# Patient Record
Sex: Male | Born: 1950 | Race: White | Hispanic: No | Marital: Married | Smoking: Former smoker
Health system: Southern US, Community
[De-identification: ages and names within clinical notes are randomized; demographics above are authoritative.]

## PROBLEM LIST (undated history)

## (undated) DIAGNOSIS — F32A Depression, unspecified: Secondary | ICD-10-CM

## (undated) DIAGNOSIS — Z9289 Personal history of other medical treatment: Secondary | ICD-10-CM

## (undated) DIAGNOSIS — C801 Malignant (primary) neoplasm, unspecified: Secondary | ICD-10-CM

## (undated) DIAGNOSIS — R200 Anesthesia of skin: Secondary | ICD-10-CM

## (undated) DIAGNOSIS — Z973 Presence of spectacles and contact lenses: Secondary | ICD-10-CM

## (undated) DIAGNOSIS — B029 Zoster without complications: Secondary | ICD-10-CM

## (undated) DIAGNOSIS — D72820 Lymphocytosis (symptomatic): Secondary | ICD-10-CM

## (undated) DIAGNOSIS — K5900 Constipation, unspecified: Secondary | ICD-10-CM

## (undated) DIAGNOSIS — J302 Other seasonal allergic rhinitis: Secondary | ICD-10-CM

## (undated) DIAGNOSIS — Z87442 Personal history of urinary calculi: Secondary | ICD-10-CM

## (undated) DIAGNOSIS — D494 Neoplasm of unspecified behavior of bladder: Secondary | ICD-10-CM

## (undated) HISTORY — DX: Zoster without complications: B02.9

## (undated) HISTORY — PX: SHOULDER ARTHROSCOPY WITH LABRAL REPAIR: SHX5691

## (undated) HISTORY — PX: LAMINECTOMY: SHX219

---

## 1998-10-25 ENCOUNTER — Encounter: Admission: RE | Admit: 1998-10-25 | Discharge: 1998-10-25 | Payer: Self-pay | Admitting: Family Medicine

## 1998-11-05 ENCOUNTER — Encounter: Payer: Self-pay | Admitting: Neurosurgery

## 1998-11-09 ENCOUNTER — Encounter: Payer: Self-pay | Admitting: Neurosurgery

## 1998-11-09 ENCOUNTER — Inpatient Hospital Stay (HOSPITAL_COMMUNITY): Admission: RE | Admit: 1998-11-09 | Discharge: 1998-11-10 | Payer: Self-pay | Admitting: Neurosurgery

## 2001-12-20 ENCOUNTER — Ambulatory Visit (HOSPITAL_COMMUNITY): Admission: RE | Admit: 2001-12-20 | Discharge: 2001-12-20 | Payer: Self-pay | Admitting: Gastroenterology

## 2005-01-09 DIAGNOSIS — B029 Zoster without complications: Secondary | ICD-10-CM

## 2005-01-09 HISTORY — DX: Zoster without complications: B02.9

## 2006-06-21 ENCOUNTER — Encounter: Admission: RE | Admit: 2006-06-21 | Discharge: 2006-06-21 | Payer: Self-pay | Admitting: Family Medicine

## 2006-09-13 ENCOUNTER — Encounter: Admission: RE | Admit: 2006-09-13 | Discharge: 2006-12-12 | Payer: Self-pay | Admitting: Family Medicine

## 2010-05-27 NOTE — Op Note (Signed)
   NAME:  Tyler Calderon, VONBARGEN NO.:  [id number]   MEDICAL RECORD NO.:  [id number]                   PATIENT TYPE:  AMB   LOCATION:  ENDO                                 FACILITY:  [facility]   PHYSICIAN:  Danise Edge, M.D.                DATE OF BIRTH:  1950/08/31   DATE OF PROCEDURE:  DATE OF DISCHARGE:                                 OPERATIVE REPORT   INDICATIONS FOR PROCEDURE:  The patient is a 60 year-old male born May 01, 1950.  The patient is scheduled to undergo his first screening colonoscopy  with polypectomy to prevent colon cancer.  There is no personal or family  history of colon cancer.   ENDOSCOPIST:  Danise Edge, M.D.   PREMEDICATION:  Versed 7 mg and Fentanyl 50 mcg.   ENDOSCOPE:  Olympus pediatric colonoscope.   PROCEDURE:  After obtaining informed consent the patient was placed in the  left lateral decubitus position.  I administered intravenous Fentanyl and  intravenous Versed to achieve conscious sedation for the procedure.  The  patient's blood pressure, oxygen saturation and cardiac rhythm were  monitored throughout the procedure and documented in the medical record.   Anal inspection was normal.  Digital rectal exam was normal.  The Olympus  pediatric video colonoscope was introduced into the rectum and easily  advanced to the cecum.  Colonic preparation for the exam today was  excellent.   Rectum:  Normal.   Sigmoid colon and descending colon:  Normal.   Splenic flexure:  Normal.   Transverse colon:  Normal.   Hepatic flexure: Normal.   Ascending colon:  Normal.   Cecum and ileocecal valve:  Normal.    ASSESSMENT:  Normal screening proctocolonoscopy  to the cecum.  No  endoscopic evidence for the presence of colorectal neoplasia.                                               Danise Edge, M.D.    MJ/MEDQ  D:  12/20/2001  T:  12/21/2001  Job:  [id number]   cc:   Dellis Anes. Idell Pickles, M.D.  [address]  [place]  [place] [address]  Fax: [phone]

## 2016-03-28 ENCOUNTER — Ambulatory Visit (INDEPENDENT_AMBULATORY_CARE_PROVIDER_SITE_OTHER): Payer: BLUE CROSS/BLUE SHIELD | Admitting: Allergy and Immunology

## 2016-03-28 ENCOUNTER — Encounter: Payer: Self-pay | Admitting: Allergy and Immunology

## 2016-03-28 VITALS — BP 132/92 | HR 84 | Temp 99.0°F | Resp 18 | Ht 73.31 in | Wt 185.0 lb

## 2016-03-28 DIAGNOSIS — J3089 Other allergic rhinitis: Secondary | ICD-10-CM | POA: Diagnosis not present

## 2016-03-28 MED ORDER — MONTELUKAST SODIUM 10 MG PO TABS: 10.0000 mg | ORAL_TABLET | Freq: Every day | ORAL | 5 refills | Status: DC

## 2016-03-28 NOTE — Progress Notes (Signed)
NEW PATIENT NOTE  Referring Provider: No ref. provider found Primary Provider: Cari Caraway, MD Date of office visit: 03/28/2016    Subjective:   Chief Complaint:  Tyler Calderon (DOB: Jul 23, 1950) is a 66 y.o. male who presents to the clinic on 03/28/2016 with a chief complaint of Nasal Congestion .  HPI: Tyler Calderon presents to this clinic in evaluation of allergic disease. Apparently he has undergone 2 courses of immunotherapy with his initial course taking place over 10 years with a break of 2 years and then his most recent course occurring over the period of 10 years. He thinks that he was skin tested about 3 years ago and had a new formulation made as a result of those results. He thinks that he was allergic to grasses and oak pollen and molds. His last injection was approximately 5 weeks ago. It is interesting to note that his skin test reactivity apparently does not change as of result of receiving immunotherapy.  His biggest complaint is that he goes through these episodes throughout the year, usually in the spring and fall, where he develops nasal congestion and sneezing and stuffy nose and postnasal drip and a cough with a rattle in his chest without any laryngitis or anosmia or decreased ability to taste. He does not develop shortness of breath or chest tightness and he has no associated gagging or retching or posttussive emesis. Usually he has clear nasal discharge but occasionally can be green. He will have this episode for 2 weeks and then he has a lingering cough that goes on for an additional 2 weeks. He will take NyQuil and DayQuil during this timeframe. These episodes appear to occur at least 2 and sometimes 3 times a year.He does not note any obvious trigger giving rise to this issue.  Past Medical History:  Diagnosis Date  . Shingles     Past Surgical History:  Procedure Laterality Date  . LAMINECTOMY  1980s   Two surgeries    Allergies as of 03/28/2016   No Known  Allergies     Medication List      ASPIRIN 81 PO Take by mouth daily.   cetirizine 10 MG tablet Commonly known as:  ZYRTEC Take 10 mg by mouth daily.   MULTIVITAMIN GUMMIES ADULT PO Take by mouth daily.   simvastatin 20 MG tablet Commonly known as:  ZOCOR Take 20 mg by mouth daily.   VITAMIN C ADULT GUMMIES PO Take by mouth daily.       Review of systems negative except as noted in HPI / PMHx or noted below:  Review of Systems  Constitutional: Negative.   HENT: Negative.   Eyes: Negative.   Respiratory: Negative.   Cardiovascular: Negative.   Gastrointestinal: Negative.   Genitourinary: Negative.   Musculoskeletal: Negative.   Skin: Negative.   Neurological: Negative.   Endo/Heme/Allergies: Negative.   Psychiatric/Behavioral: Negative.     Family History  Problem Relation Age of Onset  . Neurologic Disorder Mother     Corticobasal Syndrome  . High blood pressure Father   . Stroke Father   . High Cholesterol Sister   . High Cholesterol Brother   . Diabetes Maternal Uncle     Social History   Social History  . Marital status: Married    Spouse name: N/A  . Number of children: N/A  . Years of education: N/A   Occupational History  . Not on file.   Social History Main Topics  . Smoking  status: Former Smoker    Types: Cigarettes  . Smokeless tobacco: Not on file  . Alcohol use Not on file  . Drug use: Unknown  . Sexual activity: Not on file   Other Topics Concern  . Not on file   Social History Narrative  . No narrative on file    Environmental and Social history  Lives in a house with a dry environment, carpeting in the bedroom, no animals located inside the household, no plastic on the bed or pillow, and no smokers located inside the household. He is a retired Dance movement psychotherapist.  Objective:   Vitals:   03/28/16 1347  BP: (!) 132/92  Pulse: 84  Resp: 18  Temp: 99 F (37.2 C)   Height: 6' 1.31" (186.2 cm) Weight: 185 lb (83.9  kg)  Physical Exam  Constitutional: He is well-developed, well-nourished, and in no distress.  HENT:  Head: Normocephalic.  Right Ear: Tympanic membrane, external ear and ear canal normal.  Left Ear: Tympanic membrane, external ear and ear canal normal.  Nose: Nose normal. No mucosal edema or rhinorrhea.  Mouth/Throat: Uvula is midline, oropharynx is clear and moist and mucous membranes are normal. No oropharyngeal exudate.  Eyes: Conjunctivae are normal.  Neck: Trachea normal. No tracheal tenderness present. No tracheal deviation present. No thyromegaly present.  Cardiovascular: Normal rate, regular rhythm, S1 normal, S2 normal and normal heart sounds.   No murmur heard. Pulmonary/Chest: Breath sounds normal. No stridor. No respiratory distress. He has no wheezes. He has no rales.  Musculoskeletal: He exhibits no edema.  Lymphadenopathy:       Head (right side): No tonsillar adenopathy present.       Head (left side): No tonsillar adenopathy present.    He has no cervical adenopathy.  Neurological: He is alert. Gait normal.  Skin: No rash noted. He is not diaphoretic. No erythema. Nails show no clubbing.  Psychiatric: Mood and affect normal.    Diagnostics: Allergy skin tests were not performed.   Assessment and Plan:    1. Other allergic rhinitis     1. OTC Rhinocort/Nasacort - one spray each nostril 3-7 times per week. Take several days to work.  2. Montelukast 10 mg tablet - one tablet one time per day  3. If needed:   A. cetirizine 10 mg tablet 1 time per day  B. Nasal saline  C. Mucinex DM 2 tablets twice a  4. Further evaluation?  5. Contact clinic if problem in the future  6. Return to clinic in 6 months or earlier if problem  7. Grastek? Ragwitek?  I will have Tyler Calderon utilize a preventative medication plan in the hope of preventing the development of these episodes that he develops during the spring and fall season. It does not appear as though he has  responded adequately to immunotherapy and maybe this form of therapy just will not terminate his atopic sensitivity to environmental allergens. He may benefit from a course of oral immunotherapy directed at grass or ragweed but prior to committing him to that form of therapy will just see how he does with a combination of a nasal steroid and a leukotriene modifier as we go through this upcoming springtime season. He will contact me should there significant problem in the face of utilizing this therapy.  Allena Katz, MD Allergy / Immunology [place]

## 2016-03-28 NOTE — Patient Instructions (Addendum)
  1. OTC Rhinocort/Nasacort - one spray each nostril 3-7 times per week. Take several days to work.  2. Montelukast 10 mg tablet - one tablet one time per day  3. If needed:   A. cetirizine 10 mg tablet 1 time per day  B. Nasal saline  C. Mucinex DM 2 tablets twice a  4. Further evaluation?  5. Contact clinic if problem in the future  6. Return to clinic in 6 months or earlier if problem  7. Grastek? Ragwitek?

## 2016-03-30 ENCOUNTER — Telehealth: Payer: Self-pay | Admitting: Allergy and Immunology

## 2016-03-30 MED ORDER — MONTELUKAST SODIUM 10 MG PO TABS: 10.0000 mg | ORAL_TABLET | Freq: Every day | ORAL | 0 refills | Status: DC

## 2016-03-30 NOTE — Telephone Encounter (Signed)
Patient requested 10 day supply of montelukast due to insurance lapse. I have sent this in.

## 2016-03-30 NOTE — Telephone Encounter (Signed)
Pt called and said that [facility] would not fill his rx because of medicare so he wants it called into [facility] on [address] [phone].

## 2016-05-22 DIAGNOSIS — L821 Other seborrheic keratosis: Secondary | ICD-10-CM | POA: Diagnosis not present

## 2016-05-22 DIAGNOSIS — D1801 Hemangioma of skin and subcutaneous tissue: Secondary | ICD-10-CM | POA: Diagnosis not present

## 2016-05-22 DIAGNOSIS — D2221 Melanocytic nevi of right ear and external auricular canal: Secondary | ICD-10-CM | POA: Diagnosis not present

## 2016-05-22 DIAGNOSIS — L570 Actinic keratosis: Secondary | ICD-10-CM | POA: Diagnosis not present

## 2016-05-22 DIAGNOSIS — D2372 Other benign neoplasm of skin of left lower limb, including hip: Secondary | ICD-10-CM | POA: Diagnosis not present

## 2016-05-22 DIAGNOSIS — D2261 Melanocytic nevi of right upper limb, including shoulder: Secondary | ICD-10-CM | POA: Diagnosis not present

## 2016-05-22 DIAGNOSIS — D2262 Melanocytic nevi of left upper limb, including shoulder: Secondary | ICD-10-CM | POA: Diagnosis not present

## 2016-05-22 DIAGNOSIS — D485 Neoplasm of uncertain behavior of skin: Secondary | ICD-10-CM | POA: Diagnosis not present

## 2016-05-22 DIAGNOSIS — Z85828 Personal history of other malignant neoplasm of skin: Secondary | ICD-10-CM | POA: Diagnosis not present

## 2016-05-22 DIAGNOSIS — D225 Melanocytic nevi of trunk: Secondary | ICD-10-CM | POA: Diagnosis not present

## 2016-05-22 DIAGNOSIS — D2272 Melanocytic nevi of left lower limb, including hip: Secondary | ICD-10-CM | POA: Diagnosis not present

## 2016-05-22 DIAGNOSIS — D2271 Melanocytic nevi of right lower limb, including hip: Secondary | ICD-10-CM | POA: Diagnosis not present

## 2016-08-01 DIAGNOSIS — Z0000 Encounter for general adult medical examination without abnormal findings: Secondary | ICD-10-CM | POA: Diagnosis not present

## 2016-08-01 DIAGNOSIS — N400 Enlarged prostate without lower urinary tract symptoms: Secondary | ICD-10-CM | POA: Diagnosis not present

## 2016-08-01 DIAGNOSIS — G479 Sleep disorder, unspecified: Secondary | ICD-10-CM | POA: Diagnosis not present

## 2016-08-01 DIAGNOSIS — M79672 Pain in left foot: Secondary | ICD-10-CM | POA: Diagnosis not present

## 2016-08-01 DIAGNOSIS — Z125 Encounter for screening for malignant neoplasm of prostate: Secondary | ICD-10-CM | POA: Diagnosis not present

## 2016-08-01 DIAGNOSIS — R197 Diarrhea, unspecified: Secondary | ICD-10-CM | POA: Diagnosis not present

## 2016-08-01 DIAGNOSIS — E7800 Pure hypercholesterolemia, unspecified: Secondary | ICD-10-CM | POA: Diagnosis not present

## 2016-08-04 DIAGNOSIS — Z0000 Encounter for general adult medical examination without abnormal findings: Secondary | ICD-10-CM | POA: Diagnosis not present

## 2016-08-04 DIAGNOSIS — N401 Enlarged prostate with lower urinary tract symptoms: Secondary | ICD-10-CM | POA: Diagnosis not present

## 2016-08-04 DIAGNOSIS — G479 Sleep disorder, unspecified: Secondary | ICD-10-CM | POA: Diagnosis not present

## 2016-08-04 DIAGNOSIS — E7800 Pure hypercholesterolemia, unspecified: Secondary | ICD-10-CM | POA: Diagnosis not present

## 2016-08-04 DIAGNOSIS — M79672 Pain in left foot: Secondary | ICD-10-CM | POA: Diagnosis not present

## 2016-08-04 DIAGNOSIS — J309 Allergic rhinitis, unspecified: Secondary | ICD-10-CM | POA: Diagnosis not present

## 2016-08-04 DIAGNOSIS — Z1389 Encounter for screening for other disorder: Secondary | ICD-10-CM | POA: Diagnosis not present

## 2016-08-04 DIAGNOSIS — R3914 Feeling of incomplete bladder emptying: Secondary | ICD-10-CM | POA: Diagnosis not present

## 2016-08-04 DIAGNOSIS — Z23 Encounter for immunization: Secondary | ICD-10-CM | POA: Diagnosis not present

## 2016-08-16 DIAGNOSIS — M7751 Other enthesopathy of right foot: Secondary | ICD-10-CM | POA: Diagnosis not present

## 2016-08-16 DIAGNOSIS — G5762 Lesion of plantar nerve, left lower limb: Secondary | ICD-10-CM | POA: Diagnosis not present

## 2016-08-16 DIAGNOSIS — G5761 Lesion of plantar nerve, right lower limb: Secondary | ICD-10-CM | POA: Diagnosis not present

## 2016-08-16 DIAGNOSIS — M21962 Unspecified acquired deformity of left lower leg: Secondary | ICD-10-CM | POA: Diagnosis not present

## 2016-08-16 DIAGNOSIS — M7752 Other enthesopathy of left foot: Secondary | ICD-10-CM | POA: Diagnosis not present

## 2016-08-16 DIAGNOSIS — M21961 Unspecified acquired deformity of right lower leg: Secondary | ICD-10-CM | POA: Diagnosis not present

## 2016-08-16 DIAGNOSIS — M205X2 Other deformities of toe(s) (acquired), left foot: Secondary | ICD-10-CM | POA: Diagnosis not present

## 2016-08-29 ENCOUNTER — Ambulatory Visit (INDEPENDENT_AMBULATORY_CARE_PROVIDER_SITE_OTHER): Payer: Medicare Other | Admitting: Allergy and Immunology

## 2016-08-29 ENCOUNTER — Encounter: Payer: Self-pay | Admitting: Allergy and Immunology

## 2016-08-29 VITALS — BP 130/80 | HR 72 | Resp 18

## 2016-08-29 DIAGNOSIS — J3089 Other allergic rhinitis: Secondary | ICD-10-CM

## 2016-08-29 NOTE — Patient Instructions (Addendum)
  1. Nasacort - one spray each nostril 3-7 times per week.    2. Montelukast 10 mg tablet - one tablet one time per day  3. If needed:   A. cetirizine 10 mg tablet 1 time per day  B. Nasal saline  C. Mucinex DM 2 tablets twice a day  4. Return to clinic in 12 months or earlier if problem  5. Obtain fall flu vaccine

## 2016-08-29 NOTE — Progress Notes (Signed)
Follow-up Note  Referring Provider: Cari Caraway, MD Primary Provider: Cari Caraway, MD Date of Office Visit: 08/29/2016  Subjective:   Tyler Calderon (DOB: 05-25-1950) is a 66 y.o. male who returns to the [place] on 08/29/2016 in re-evaluation of the following:  HPI: Tyler Calderon returns to this clinic in reevaluation of his allergic rhinitis. I last saw him in this clinic 03/28/2016 at which point in time we assigned him a plan to use preventative anti-inflammatory medications for his respiratory tract.  He has had an excellent response to this approach and has been able to go through the spring with no problem whatsoever. He has not developed either a sinus infection or an episode of bronchitis and overall feels good while consistently using Nasacort and montelukast. He no longer requires a antihistamine.  Allergies as of 08/29/2016   No Known Allergies     Medication List      ASPIRIN 81 PO Take by mouth daily.   montelukast 10 MG tablet Commonly known as:  SINGULAIR Take 1 tablet (10 mg total) by mouth at bedtime.   MULTIVITAMIN GUMMIES ADULT PO Take by mouth daily.   NASACORT ALLERGY 24HR 55 MCG/ACT Aero nasal inhaler Generic drug:  triamcinolone Use one spray in each nostril once daily as directed.   simvastatin 20 MG tablet Commonly known as:  ZOCOR Take 20 mg by mouth daily.   tamsulosin 0.4 MG Caps capsule Commonly known as:  FLOMAX       Past Medical History:  Diagnosis Date  . Shingles     Past Surgical History:  Procedure Laterality Date  . LAMINECTOMY  1980s   Two surgeries    Review of systems negative except as noted in HPI / PMHx or noted below:  Review of Systems  Constitutional: Negative.   HENT: Negative.   Eyes: Negative.   Respiratory: Negative.   Cardiovascular: Negative.   Gastrointestinal: Negative.   Genitourinary: Negative.   Musculoskeletal: Negative.   Skin: Negative.   Neurological: Negative.     Endo/Heme/Allergies: Negative.   Psychiatric/Behavioral: Negative.      Objective:   Vitals:   08/29/16 1507  BP: 130/80  Pulse: 72  Resp: 18          Physical Exam  Constitutional: He is well-developed, well-nourished, and in no distress.  HENT:  Head: Normocephalic.  Right Ear: Tympanic membrane, external ear and ear canal normal.  Left Ear: Tympanic membrane, external ear and ear canal normal.  Nose: Nose normal. No mucosal edema or rhinorrhea.  Mouth/Throat: Uvula is midline, oropharynx is clear and moist and mucous membranes are normal. No oropharyngeal exudate.  Eyes: Conjunctivae are normal.  Neck: Trachea normal. No tracheal tenderness present. No tracheal deviation present. No thyromegaly present.  Cardiovascular: Normal rate, regular rhythm, S1 normal, S2 normal and normal heart sounds.   No murmur heard. Pulmonary/Chest: Breath sounds normal. No stridor. No respiratory distress. He has no wheezes. He has no rales.  Musculoskeletal: He exhibits no edema.  Lymphadenopathy:       Head (right side): No tonsillar adenopathy present.       Head (left side): No tonsillar adenopathy present.    He has no cervical adenopathy.  Neurological: He is alert. Gait normal.  Skin: No rash noted. He is not diaphoretic. No erythema. Nails show no clubbing.  Psychiatric: Mood and affect normal.    Diagnostics: none  Assessment and Plan:   1. Other allergic rhinitis  1. Nasacort - one spray each nostril 3-7 times per week.    2. Montelukast 10 mg tablet - one tablet one time per day  3. If needed:   A. cetirizine 10 mg tablet 1 time per day  B. Nasal saline  C. Mucinex DM 2 tablets twice a day  4. Return to clinic in 12 months or earlier if problem  5. Obtain fall flu vaccine  Tyler Calderon appears to be doing quite well on his current plan and I will assume that he will go through the rest of this year without any difficulty and he will contact me should there be a  significant problem as he moves through this next year. He will try to modify the amounts of Nasacort required to maintain good control of his atopic disease allways aiming for the least amount of medication that gives him that result.  Allena Katz, MD Allergy / Immunology [place]

## 2016-08-30 DIAGNOSIS — M7752 Other enthesopathy of left foot: Secondary | ICD-10-CM | POA: Diagnosis not present

## 2016-08-30 DIAGNOSIS — M7751 Other enthesopathy of right foot: Secondary | ICD-10-CM | POA: Diagnosis not present

## 2016-08-30 DIAGNOSIS — M21961 Unspecified acquired deformity of right lower leg: Secondary | ICD-10-CM | POA: Diagnosis not present

## 2016-08-30 DIAGNOSIS — G5762 Lesion of plantar nerve, left lower limb: Secondary | ICD-10-CM | POA: Diagnosis not present

## 2016-08-30 DIAGNOSIS — M21962 Unspecified acquired deformity of left lower leg: Secondary | ICD-10-CM | POA: Diagnosis not present

## 2016-09-13 DIAGNOSIS — G5762 Lesion of plantar nerve, left lower limb: Secondary | ICD-10-CM | POA: Diagnosis not present

## 2016-09-13 DIAGNOSIS — M722 Plantar fascial fibromatosis: Secondary | ICD-10-CM | POA: Diagnosis not present

## 2016-09-13 DIAGNOSIS — G5761 Lesion of plantar nerve, right lower limb: Secondary | ICD-10-CM | POA: Diagnosis not present

## 2016-09-25 ENCOUNTER — Other Ambulatory Visit: Payer: Self-pay | Admitting: Allergy and Immunology

## 2016-10-10 DIAGNOSIS — G5762 Lesion of plantar nerve, left lower limb: Secondary | ICD-10-CM | POA: Diagnosis not present

## 2016-10-10 DIAGNOSIS — M722 Plantar fascial fibromatosis: Secondary | ICD-10-CM | POA: Diagnosis not present

## 2016-10-10 DIAGNOSIS — G5761 Lesion of plantar nerve, right lower limb: Secondary | ICD-10-CM | POA: Diagnosis not present

## 2016-10-26 DIAGNOSIS — Z23 Encounter for immunization: Secondary | ICD-10-CM | POA: Diagnosis not present

## 2016-12-12 DIAGNOSIS — M722 Plantar fascial fibromatosis: Secondary | ICD-10-CM | POA: Diagnosis not present

## 2016-12-12 DIAGNOSIS — G5762 Lesion of plantar nerve, left lower limb: Secondary | ICD-10-CM | POA: Diagnosis not present

## 2016-12-12 DIAGNOSIS — G5761 Lesion of plantar nerve, right lower limb: Secondary | ICD-10-CM | POA: Diagnosis not present

## 2017-01-03 ENCOUNTER — Other Ambulatory Visit: Payer: Self-pay

## 2017-01-03 MED ORDER — MONTELUKAST SODIUM 10 MG PO TABS: 10.0000 mg | ORAL_TABLET | Freq: Every day | ORAL | 1 refills | Status: DC

## 2017-01-03 NOTE — Telephone Encounter (Signed)
Received fax for 90 day supply of montelukast. Patient was last seen 08/29/2016. Patient is to return in a year. Request sent in.

## 2017-02-01 NOTE — Telephone Encounter (Signed)
error 

## 2017-05-22 DIAGNOSIS — D2262 Melanocytic nevi of left upper limb, including shoulder: Secondary | ICD-10-CM | POA: Diagnosis not present

## 2017-05-22 DIAGNOSIS — L570 Actinic keratosis: Secondary | ICD-10-CM | POA: Diagnosis not present

## 2017-05-22 DIAGNOSIS — D485 Neoplasm of uncertain behavior of skin: Secondary | ICD-10-CM | POA: Diagnosis not present

## 2017-05-22 DIAGNOSIS — D2261 Melanocytic nevi of right upper limb, including shoulder: Secondary | ICD-10-CM | POA: Diagnosis not present

## 2017-05-22 DIAGNOSIS — L821 Other seborrheic keratosis: Secondary | ICD-10-CM | POA: Diagnosis not present

## 2017-05-22 DIAGNOSIS — D2272 Melanocytic nevi of left lower limb, including hip: Secondary | ICD-10-CM | POA: Diagnosis not present

## 2017-05-22 DIAGNOSIS — D225 Melanocytic nevi of trunk: Secondary | ICD-10-CM | POA: Diagnosis not present

## 2017-05-22 DIAGNOSIS — D1801 Hemangioma of skin and subcutaneous tissue: Secondary | ICD-10-CM | POA: Diagnosis not present

## 2017-05-22 DIAGNOSIS — Z85828 Personal history of other malignant neoplasm of skin: Secondary | ICD-10-CM | POA: Diagnosis not present

## 2017-07-11 ENCOUNTER — Other Ambulatory Visit: Payer: Self-pay | Admitting: Allergy and Immunology

## 2017-08-07 DIAGNOSIS — N529 Male erectile dysfunction, unspecified: Secondary | ICD-10-CM | POA: Diagnosis not present

## 2017-08-07 DIAGNOSIS — Z0000 Encounter for general adult medical examination without abnormal findings: Secondary | ICD-10-CM | POA: Diagnosis not present

## 2017-08-07 DIAGNOSIS — G479 Sleep disorder, unspecified: Secondary | ICD-10-CM | POA: Diagnosis not present

## 2017-08-07 DIAGNOSIS — N401 Enlarged prostate with lower urinary tract symptoms: Secondary | ICD-10-CM | POA: Diagnosis not present

## 2017-08-07 DIAGNOSIS — Z1389 Encounter for screening for other disorder: Secondary | ICD-10-CM | POA: Diagnosis not present

## 2017-08-07 DIAGNOSIS — E7800 Pure hypercholesterolemia, unspecified: Secondary | ICD-10-CM | POA: Diagnosis not present

## 2017-08-07 DIAGNOSIS — J309 Allergic rhinitis, unspecified: Secondary | ICD-10-CM | POA: Diagnosis not present

## 2017-08-07 DIAGNOSIS — Z125 Encounter for screening for malignant neoplasm of prostate: Secondary | ICD-10-CM | POA: Diagnosis not present

## 2017-08-07 DIAGNOSIS — Z23 Encounter for immunization: Secondary | ICD-10-CM | POA: Diagnosis not present

## 2017-08-08 DIAGNOSIS — E7800 Pure hypercholesterolemia, unspecified: Secondary | ICD-10-CM | POA: Diagnosis not present

## 2017-08-08 DIAGNOSIS — N401 Enlarged prostate with lower urinary tract symptoms: Secondary | ICD-10-CM | POA: Diagnosis not present

## 2017-08-08 DIAGNOSIS — N529 Male erectile dysfunction, unspecified: Secondary | ICD-10-CM | POA: Diagnosis not present

## 2017-08-08 DIAGNOSIS — G479 Sleep disorder, unspecified: Secondary | ICD-10-CM | POA: Diagnosis not present

## 2017-08-08 DIAGNOSIS — Z125 Encounter for screening for malignant neoplasm of prostate: Secondary | ICD-10-CM | POA: Diagnosis not present

## 2017-08-08 DIAGNOSIS — J309 Allergic rhinitis, unspecified: Secondary | ICD-10-CM | POA: Diagnosis not present

## 2017-08-11 ENCOUNTER — Other Ambulatory Visit: Payer: Self-pay | Admitting: Allergy and Immunology

## 2017-08-21 ENCOUNTER — Encounter: Payer: Self-pay | Admitting: Allergy and Immunology

## 2017-08-21 ENCOUNTER — Ambulatory Visit (INDEPENDENT_AMBULATORY_CARE_PROVIDER_SITE_OTHER): Payer: Medicare Other | Admitting: Allergy and Immunology

## 2017-08-21 VITALS — BP 120/60 | HR 80 | Resp 16 | Ht 73.5 in | Wt 177.8 lb

## 2017-08-21 DIAGNOSIS — R0609 Other forms of dyspnea: Secondary | ICD-10-CM | POA: Diagnosis not present

## 2017-08-21 DIAGNOSIS — J3089 Other allergic rhinitis: Secondary | ICD-10-CM

## 2017-08-21 DIAGNOSIS — R0600 Dyspnea, unspecified: Secondary | ICD-10-CM

## 2017-08-21 MED ORDER — MONTELUKAST SODIUM 10 MG PO TABS: ORAL_TABLET | ORAL | 11 refills | Status: DC

## 2017-08-21 NOTE — Patient Instructions (Addendum)
  1. Continue Nasacort - one spray each nostril 3-7 times per week.    2. Continue Montelukast 10 mg tablet - one tablet one time per day  3. If needed:   A. cetirizine 10 mg tablet 1 time per day  B. Nasal saline  C. Mucinex DM 2 tablets twice a day  4. Return to clinic in 12 months or earlier if problem  5. Obtain fall flu vaccine  6. Discuss with primary MD about SOB. Exercise treadmill test? Chest X-ray? Blood tests?

## 2017-08-21 NOTE — Progress Notes (Signed)
Follow-up Note  Referring Provider: Cari Caraway, MD Primary Provider: Cari Caraway, MD Date of Office Visit: 08/21/2017  Subjective:   Tyler Calderon (DOB: [birth year]) is a 67 y.o. male who returns to the [place] on 08/21/2017 in re-evaluation of the following:  HPI: Tyler Calderon returns to this clinic in evaluation of his allergic rhinitis and an issue with shortness of breath.  I last saw him in this clinic on 29 August 2016.  While consistently using a nasal steroid and a leukotriene modifier he has done very well with his airway issue and rarely has any significant upper airway symptoms.  He has not required an antibiotic or systemic steroid to treat any type of respiratory tract issue.  However, he has developed a problems with dyspnea on exertion over the course of the past 3 to 4 months.  Apparently if he exerts himself such as going up a flight of stairs or if he is using his arms and walking such as carrying a box he does get short and he must stop his activity and he must rest for 15 to 30 seconds at which point in time he resolves his shortness of breath.  He has no associated chest pain or coughing or wheezing or other respiratory symptoms.  There has not really been a significant environmental change in his house and he has not started any new medications that may contribute to this issue.  Allergies as of 08/21/2017   No Known Allergies     Medication List      ASPIRIN 81 PO Take by mouth daily.   LORazepam 0.5 MG tablet Commonly known as:  ATIVAN 1-2 TABS BEFORE BEDTIME AS NEEDED ORALLY 30 DAYS   montelukast 10 MG tablet Commonly known as:  SINGULAIR TAKE 1 TABLET BY MOUTH EVERYDAY AT BEDTIME   MULTIVITAMIN GUMMIES ADULT PO Take by mouth daily.   NASACORT ALLERGY 24HR 55 MCG/ACT Aero nasal inhaler Generic drug:  triamcinolone Use one spray in each nostril once daily as directed.   simvastatin 20 MG tablet Commonly known as:  ZOCOR Take  20 mg by mouth daily.   tamsulosin 0.4 MG Caps capsule Commonly known as:  FLOMAX       Past Medical History:  Diagnosis Date  . Shingles     Past Surgical History:  Procedure Laterality Date  . LAMINECTOMY  1980s   Two surgeries    Review of systems negative except as noted in HPI / PMHx or noted below:  Review of Systems  Constitutional: Negative.   HENT: Negative.   Eyes: Negative.   Respiratory: Negative.   Cardiovascular: Negative.   Gastrointestinal: Negative.   Genitourinary: Negative.   Musculoskeletal: Negative.   Skin: Negative.   Neurological: Negative.   Endo/Heme/Allergies: Negative.   Psychiatric/Behavioral: Negative.      Objective:   Vitals:   08/21/17 1055  BP: 120/60  Pulse: 80  Resp: 16   Height: 6' 1.5" (186.7 cm)  Weight: 177 lb 12.8 oz (80.6 kg)   Physical Exam  HENT:  Head: Normocephalic.  Right Ear: Tympanic membrane, external ear and ear canal normal.  Left Ear: Tympanic membrane, external ear and ear canal normal.  Nose: Nose normal. No mucosal edema or rhinorrhea.  Mouth/Throat: Uvula is midline, oropharynx is clear and moist and mucous membranes are normal. No oropharyngeal exudate.  Eyes: Conjunctivae are normal.  Neck: Trachea normal. No tracheal tenderness present. No tracheal deviation present. No thyromegaly present.  Cardiovascular:  Normal rate, regular rhythm, S1 normal, S2 normal and normal heart sounds.  No murmur heard. Pulmonary/Chest: Breath sounds normal. No stridor. No respiratory distress. He has no wheezes. He has no rales.  Musculoskeletal: He exhibits no edema.  Lymphadenopathy:       Head (right side): No tonsillar adenopathy present.       Head (left side): No tonsillar adenopathy present.    He has no cervical adenopathy.  Neurological: He is alert.  Skin: No rash noted. He is not diaphoretic. No erythema. Nails show no clubbing.    Diagnostics:    Spirometry was performed and demonstrated an FEV1  of 3.79 at 96 % of predicted.  Oxygen saturation at rest on room air was 98%.  Oxygen saturation with exercise walking up and down the hallway on room air was 98%.  Assessment and Plan:   1. Other allergic rhinitis   2. Dyspnea on exertion     1. Continue Nasacort - one spray each nostril 3-7 times per week.    2. Continue Montelukast 10 mg tablet - one tablet one time per day  3. If needed:   A. cetirizine 10 mg tablet 1 time per day  B. Nasal saline  C. Mucinex DM 2 tablets twice a day  4. Return to clinic in 12 months or earlier if problem  5. Obtain fall flu vaccine  6. Discuss with primary MD about SOB. Exercise treadmill test? Chest X-ray? Blood tests?  Tyler Calderon appears to have his atopic respiratory disease under very good control on his current plan and I am not really going to change any of his medicines.  I asked him to discuss with his primary doctor about his dyspnea on exertion.  It may be best for him to have a exercise treadmill test and a chest x-ray and blood tests in  investigation for a systemic disease contributing to this issue.  If he does well I will see him back in this clinic in 12 months or earlier if there is a problem.  Allena Katz, MD Allergy / Immunology [place]

## 2017-08-22 ENCOUNTER — Encounter: Payer: Self-pay | Admitting: Allergy and Immunology

## 2017-08-28 ENCOUNTER — Telehealth: Payer: Self-pay | Admitting: Allergy and Immunology

## 2017-08-28 DIAGNOSIS — R0609 Other forms of dyspnea: Secondary | ICD-10-CM

## 2017-08-28 DIAGNOSIS — R0600 Dyspnea, unspecified: Secondary | ICD-10-CM

## 2017-08-28 NOTE — Telephone Encounter (Signed)
Will you please advise 

## 2017-08-28 NOTE — Telephone Encounter (Signed)
Patient was told to call his PCP to set up a stress test per KOZLOW. Patients PCP will not schedule or order one with an evaluation Patient is hoping that KOZLOW can send in the order for a stress test

## 2017-08-28 NOTE — Telephone Encounter (Signed)
I do not really understand the message.  Is the issue that his primary care doctor will not order a exercise test unless he is seen by her his primary care doctor?

## 2017-08-29 NOTE — Telephone Encounter (Signed)
I called and spoke with Tyler Calderon and his Primary will not order the stress test unless he makes an appointment with them. He wanted to know if you would just go ahead and order the stress test so that it could move along faster. I told him I would reach out and ask and then give him a call back. Will you please advise? Thank You.

## 2017-09-03 NOTE — Telephone Encounter (Signed)
Please arrange for Chest X-ray, CBC w/diff, and a exercise treadmill test

## 2017-09-04 NOTE — Telephone Encounter (Signed)
Patient is in [place] for 10 more day and will get the chest xray and blood test when he returns. Please schedule stress test after as well.

## 2017-09-04 NOTE — Telephone Encounter (Signed)
Left message for patient to call the office

## 2017-09-04 NOTE — Telephone Encounter (Signed)
Please review and sign orders.

## 2017-09-13 ENCOUNTER — Ambulatory Visit
Admission: RE | Admit: 2017-09-13 | Discharge: 2017-09-13 | Disposition: A | Discharge status: Home / Self Care | Payer: Medicare Other | Source: Ambulatory Visit | Attending: Allergy and Immunology | Admitting: Allergy and Immunology

## 2017-09-13 DIAGNOSIS — R0609 Other forms of dyspnea: Secondary | ICD-10-CM | POA: Diagnosis not present

## 2017-09-13 NOTE — Telephone Encounter (Signed)
[facility] called wanting order for chest xray faxed to them writer faxed to Shirlean Mylar [phone]. Also patient came in for CBC lab test released and Ernst Bowler drew

## 2017-09-14 LAB — CBC WITH DIFFERENTIAL/PLATELET
Basophils Absolute: 0 10*3/uL (ref 0.0–0.2)
Basos: 0 %
EOS (ABSOLUTE): 0.4 10*3/uL (ref 0.0–0.4)
Eos: 4 %
Hematocrit: 43.9 % (ref 37.5–51.0)
Hemoglobin: 14.5 g/dL (ref 13.0–17.7)
Immature Grans (Abs): 0 10*3/uL (ref 0.0–0.1)
Immature Granulocytes: 0 %
Lymphocytes Absolute: 3.8 10*3/uL — ABNORMAL HIGH (ref 0.7–3.1)
Lymphs: 39 %
MCH: 31.3 pg (ref 26.6–33.0)
MCHC: 33 g/dL (ref 31.5–35.7)
MCV: 95 fL (ref 79–97)
Monocytes Absolute: 0.9 10*3/uL (ref 0.1–0.9)
Monocytes: 9 %
Neutrophils Absolute: 4.7 10*3/uL (ref 1.4–7.0)
Neutrophils: 48 %
Platelets: 147 10*3/uL — ABNORMAL LOW (ref 150–450)
RBC: 4.63 x10E6/uL (ref 4.14–5.80)
RDW: 13.7 % (ref 12.3–15.4)
WBC: 9.8 10*3/uL (ref 3.4–10.8)

## 2017-09-17 NOTE — Telephone Encounter (Signed)
Patient informed of lab results Dr Neldon Mc please advise if still needs exercise treadmill test. Also please advise if correct one is being ordered it is in encounter as pended?

## 2017-09-17 NOTE — Telephone Encounter (Signed)
Stress test ordered;

## 2017-09-17 NOTE — Telephone Encounter (Signed)
Where is this order located.  I cannot seem to find it in epic.  It should be a standardized exercise treadmill test and evaluation of possible heart disease.

## 2017-09-18 ENCOUNTER — Telehealth: Payer: Self-pay

## 2017-09-18 DIAGNOSIS — R0609 Other forms of dyspnea: Secondary | ICD-10-CM

## 2017-09-18 DIAGNOSIS — R0600 Dyspnea, unspecified: Secondary | ICD-10-CM

## 2017-09-18 NOTE — Telephone Encounter (Signed)
Echo stress test was ordered 09/17/17

## 2017-09-18 NOTE — Telephone Encounter (Signed)
I have never scheduled a stress test before. Where does the order go? Who would I call for this?   Thanks

## 2017-09-18 NOTE — Telephone Encounter (Signed)
-----   Message from Horris Latino, [place] sent at 09/17/2017  3:32 PM EDT ----- Regarding: stress test Please call and schedule exercise stress test. Order placed. Thank you

## 2017-09-19 ENCOUNTER — Telehealth (HOSPITAL_COMMUNITY): Payer: Self-pay | Admitting: Radiology

## 2017-09-19 DIAGNOSIS — I519 Heart disease, unspecified: Secondary | ICD-10-CM

## 2017-09-19 NOTE — Telephone Encounter (Signed)
The patient was scheduled for a stress echocardiogram instead of an exercise tolerance test. Per Dr. Neldon Mc, an exercise stress test needs to be ordered

## 2017-09-20 DIAGNOSIS — Z23 Encounter for immunization: Secondary | ICD-10-CM | POA: Diagnosis not present

## 2017-09-20 NOTE — Addendum Note (Signed)
Addended by: Horris Latino on: 09/20/2017 02:47 PM   Modules accepted: Orders

## 2017-09-20 NOTE — Telephone Encounter (Signed)
Hey will forward to our nurses to fix.  Thanks

## 2017-09-20 NOTE — Telephone Encounter (Signed)
Please see telephone contact by Cardiology office.

## 2017-09-20 NOTE — Telephone Encounter (Signed)
Order fixed.  

## 2017-09-20 NOTE — Telephone Encounter (Signed)
Ordered placed. Call [phone] to schedule.

## 2017-09-26 ENCOUNTER — Telehealth: Payer: Self-pay

## 2017-09-26 NOTE — Telephone Encounter (Signed)
Hey, Checking to make sure the order was put in correctly.  Thanks

## 2017-09-26 NOTE — Telephone Encounter (Signed)
Error

## 2017-10-02 ENCOUNTER — Other Ambulatory Visit: Payer: Self-pay | Admitting: Cardiology

## 2017-10-02 DIAGNOSIS — R011 Cardiac murmur, unspecified: Secondary | ICD-10-CM

## 2017-10-09 ENCOUNTER — Ambulatory Visit (INDEPENDENT_AMBULATORY_CARE_PROVIDER_SITE_OTHER): Payer: Medicare Other

## 2017-10-09 DIAGNOSIS — I519 Heart disease, unspecified: Secondary | ICD-10-CM

## 2017-10-09 LAB — EXERCISE TOLERANCE TEST
Estimated workload: 10.1 METS
Exercise duration (min): 8 min
Exercise duration (sec): 0 s
MPHR: 154 {beats}/min
Peak HR: 160 {beats}/min
Percent HR: 103 %
RPE: 16
Rest HR: 76 {beats}/min

## 2017-10-11 DIAGNOSIS — K648 Other hemorrhoids: Secondary | ICD-10-CM | POA: Diagnosis not present

## 2017-10-11 DIAGNOSIS — Z86010 Personal history of colonic polyps: Secondary | ICD-10-CM | POA: Diagnosis not present

## 2017-10-11 DIAGNOSIS — D122 Benign neoplasm of ascending colon: Secondary | ICD-10-CM | POA: Diagnosis not present

## 2017-10-11 DIAGNOSIS — D120 Benign neoplasm of cecum: Secondary | ICD-10-CM | POA: Diagnosis not present

## 2017-10-16 DIAGNOSIS — D122 Benign neoplasm of ascending colon: Secondary | ICD-10-CM | POA: Diagnosis not present

## 2017-10-16 DIAGNOSIS — D120 Benign neoplasm of cecum: Secondary | ICD-10-CM | POA: Diagnosis not present

## 2017-12-03 DIAGNOSIS — L218 Other seborrheic dermatitis: Secondary | ICD-10-CM | POA: Diagnosis not present

## 2017-12-03 DIAGNOSIS — B078 Other viral warts: Secondary | ICD-10-CM | POA: Diagnosis not present

## 2017-12-03 DIAGNOSIS — L821 Other seborrheic keratosis: Secondary | ICD-10-CM | POA: Diagnosis not present

## 2017-12-03 DIAGNOSIS — Z85828 Personal history of other malignant neoplasm of skin: Secondary | ICD-10-CM | POA: Diagnosis not present

## 2017-12-03 DIAGNOSIS — D225 Melanocytic nevi of trunk: Secondary | ICD-10-CM | POA: Diagnosis not present

## 2017-12-17 DIAGNOSIS — R0609 Other forms of dyspnea: Secondary | ICD-10-CM | POA: Diagnosis not present

## 2017-12-17 NOTE — Addendum Note (Signed)
Addended by: Orlene Erm on: 12/17/2017 03:25 PM   Modules accepted: Orders

## 2017-12-18 LAB — CBC WITH DIFFERENTIAL
Basophils Absolute: 0 10*3/uL (ref 0.0–0.2)
Basos: 0 %
EOS (ABSOLUTE): 0.4 10*3/uL (ref 0.0–0.4)
Eos: 3 %
Hematocrit: 42.5 % (ref 37.5–51.0)
Hemoglobin: 14.9 g/dL (ref 13.0–17.7)
Immature Grans (Abs): 0 10*3/uL (ref 0.0–0.1)
Immature Granulocytes: 0 %
Lymphocytes Absolute: 4 10*3/uL — ABNORMAL HIGH (ref 0.7–3.1)
Lymphs: 37 %
MCH: 31.6 pg (ref 26.6–33.0)
MCHC: 35.1 g/dL (ref 31.5–35.7)
MCV: 90 fL (ref 79–97)
Monocytes Absolute: 0.8 10*3/uL (ref 0.1–0.9)
Monocytes: 7 %
Neutrophils Absolute: 5.6 10*3/uL (ref 1.4–7.0)
Neutrophils: 53 %
RBC: 4.71 x10E6/uL (ref 4.14–5.80)
RDW: 12.8 % (ref 12.3–15.4)
WBC: 10.8 10*3/uL (ref 3.4–10.8)

## 2017-12-24 ENCOUNTER — Other Ambulatory Visit: Payer: Self-pay

## 2017-12-24 ENCOUNTER — Telehealth: Payer: Self-pay

## 2017-12-24 DIAGNOSIS — R7989 Other specified abnormal findings of blood chemistry: Secondary | ICD-10-CM

## 2017-12-24 DIAGNOSIS — D72820 Lymphocytosis (symptomatic): Secondary | ICD-10-CM | POA: Diagnosis not present

## 2017-12-24 NOTE — Telephone Encounter (Signed)
Orders placed.

## 2017-12-24 NOTE — Telephone Encounter (Signed)
Patient was advise to come in to have blood work done

## 2017-12-27 LAB — COMP PANEL: LEUKEMIA/LYMPHOMA

## 2017-12-28 ENCOUNTER — Telehealth: Payer: Self-pay

## 2017-12-28 NOTE — Telephone Encounter (Signed)
Referral has been placed into epic

## 2017-12-28 NOTE — Telephone Encounter (Signed)
-----   Message from Herbie Drape, LPN sent at 11/65/7903  3:30 PM EST ----- Please refer patient to Dr. Maurilio Lovely for lymphocytosis per Dr. Neldon Mc. Thank You.

## 2017-12-31 ENCOUNTER — Telehealth: Payer: Self-pay

## 2017-12-31 NOTE — Telephone Encounter (Signed)
Received patients flow cytometry report in the mail. Dr. Neldon Mc is out and it has been place in the nurses station for a nurse to have Dr. Ernst Bowler to look at it.

## 2018-01-01 NOTE — Telephone Encounter (Signed)
Gave to Dr. Ernst Bowler to look at it and he stated everything looked normal. I will call and let pt it was normal.

## 2018-01-23 ENCOUNTER — Telehealth: Payer: Self-pay | Admitting: Hematology and Oncology

## 2018-01-23 ENCOUNTER — Encounter: Payer: Self-pay | Admitting: Hematology and Oncology

## 2018-01-23 NOTE — Telephone Encounter (Signed)
Received a new referral from Dr. Neldon Mc for lymphocytosis. Pt has been cld and scheduled to see Dr. Alvy Bimler on 1/28 at 1015am. Pt aware to arrive 30 minutes early. Letter mailed.

## 2018-02-05 ENCOUNTER — Encounter: Payer: Self-pay | Admitting: Hematology and Oncology

## 2018-02-05 ENCOUNTER — Inpatient Hospital Stay: Payer: Medicare Other | Attending: Hematology and Oncology | Admitting: Hematology and Oncology

## 2018-02-05 ENCOUNTER — Telehealth: Payer: Self-pay | Admitting: Hematology and Oncology

## 2018-02-05 DIAGNOSIS — Z79899 Other long term (current) drug therapy: Secondary | ICD-10-CM | POA: Diagnosis not present

## 2018-02-05 DIAGNOSIS — R0600 Dyspnea, unspecified: Secondary | ICD-10-CM

## 2018-02-05 DIAGNOSIS — Z789 Other specified health status: Secondary | ICD-10-CM | POA: Insufficient documentation

## 2018-02-05 DIAGNOSIS — Z87891 Personal history of nicotine dependence: Secondary | ICD-10-CM | POA: Diagnosis not present

## 2018-02-05 DIAGNOSIS — R0609 Other forms of dyspnea: Secondary | ICD-10-CM | POA: Insufficient documentation

## 2018-02-05 DIAGNOSIS — Z7982 Long term (current) use of aspirin: Secondary | ICD-10-CM | POA: Insufficient documentation

## 2018-02-05 DIAGNOSIS — D72820 Lymphocytosis (symptomatic): Secondary | ICD-10-CM | POA: Insufficient documentation

## 2018-02-05 NOTE — Progress Notes (Signed)
[place] NOTE  Patient Care Team: Cari Caraway, MD as PCP - General (Family Medicine)  CHIEF COMPLAINTS/PURPOSE OF CONSULTATION:  Mild lymphocytosis, MBUS  HISTORY OF PRESENTING ILLNESS:  Tyler Calderon 68 y.o. male is here because of elevated lymphocyte count. He is accompanied by his wife, Tomi Bamberger The patient is a retired Dance movement psychotherapist.  He does not smoke. He has chronic history of sinusitis/allergies and is followed by an allergist Most recently, he complained of shortness of breath on exertion.  He underwent cardiac evaluation including a negative treadmill test  On his blood work, he was noted to have mild lymphocytosis.  Flow cytometry detected monoclonal B-cell population less than 5000, nonspecific phenotype.  He was found to have abnormal CBC from his blood works in September 2019.  The total WBC is within normal limits.  The total absolute lymphocyte count is less than 5000 He denies recent infection. The last prescription antibiotics was more than 3 months ago There is not reported symptoms of urinary frequency/urgency or dysuria, diarrhea, joint swelling/pain or abnormal skin rash.  He had no prior history or diagnosis of cancer. His age appropriate screening programs are up-to-date. The patient has no prior diagnosis of autoimmune disease and was not prescribed corticosteroids related products.   MEDICAL HISTORY:  Past Medical History:  Diagnosis Date  . Shingles     SURGICAL HISTORY: Past Surgical History:  Procedure Laterality Date  . LAMINECTOMY  1980s   Two surgeries  . SHOULDER ARTHROSCOPY WITH LABRAL REPAIR Right    47    SOCIAL HISTORY: Social History   Socioeconomic History  . Marital status: Married    Spouse name: Rosann Auerbach  . Number of children: Not on file  . Years of education: Not on file  . Highest education level: Not on file  Occupational History  . Occupation: retired Dance movement psychotherapist  Social Needs  .  Financial resource strain: Not on file  . Food insecurity:    Worry: Not on file    Inability: Not on file  . Transportation needs:    Medical: Not on file    Non-medical: Not on file  Tobacco Use  . Smoking status: Former Smoker    Types: Cigarettes  . Smokeless tobacco: Never Used  Substance and Sexual Activity  . Alcohol use: Yes    Alcohol/week: 28.0 standard drinks    Types: 28 Glasses of wine per week  . Drug use: Not on file  . Sexual activity: Not on file  Lifestyle  . Physical activity:    Days per week: Not on file    Minutes per session: Not on file  . Stress: Not on file  Relationships  . Social connections:    Talks on phone: Not on file    Gets together: Not on file    Attends religious service: Not on file    Active member of club or organization: Not on file    Attends meetings of clubs or organizations: Not on file    Relationship status: Not on file  . Intimate partner violence:    Fear of current or ex partner: Not on file    Emotionally abused: Not on file    Physically abused: Not on file    Forced sexual activity: Not on file  Other Topics Concern  . Not on file  Social History Narrative  . Not on file    FAMILY HISTORY: Family History  Problem Relation Age of Onset  .  Neurologic Disorder Mother        Corticobasal Syndrome  . High blood pressure Father   . Stroke Father   . High Cholesterol Sister   . High Cholesterol Brother   . Diabetes Maternal Uncle     ALLERGIES:  has No Known Allergies.  MEDICATIONS:  Current Outpatient Medications  Medication Sig Dispense Refill  . ASPIRIN 81 PO Take by mouth daily.    Marland Kitchen LORazepam (ATIVAN) 0.5 MG tablet 1-2 TABS BEFORE BEDTIME AS NEEDED ORALLY 30 DAYS  0  . montelukast (SINGULAIR) 10 MG tablet TAKE 1 TABLET BY MOUTH EVERYDAY AT BEDTIME 30 tablet 11  . Multiple Vitamins-Minerals (MULTIVITAMIN GUMMIES ADULT PO) Take by mouth daily.    Marland Kitchen NASACORT ALLERGY 24HR 55 MCG/ACT AERO nasal inhaler Use  one spray in each nostril once daily as directed.    . simvastatin (ZOCOR) 20 MG tablet Take 20 mg by mouth daily.     . tamsulosin (FLOMAX) 0.4 MG CAPS capsule      No current facility-administered medications for this visit.     REVIEW OF SYSTEMS:   Constitutional: Denies fevers, chills or abnormal night sweats Eyes: Denies blurriness of vision, double vision or watery eyes Ears, nose, mouth, throat, and face: Denies mucositis or sore throat Respiratory: Denies cough, dyspnea or wheezes Cardiovascular: Denies palpitation, chest discomfort or lower extremity swelling Gastrointestinal:  Denies nausea, heartburn or change in bowel habits Skin: Denies abnormal skin rashes Lymphatics: Denies new lymphadenopathy or easy bruising Neurological:Denies numbness, tingling or new weaknesses Behavioral/Psych: Mood is stable, no new changes  All other systems were reviewed with the patient and are negative.  PHYSICAL EXAMINATION: ECOG PERFORMANCE STATUS: 0 - Asymptomatic  Vitals:   02/05/18 1005  BP: 117/62  Pulse: 91  Resp: 18  Temp: 97.8 F (36.6 C)  SpO2: 100%   Filed Weights   02/05/18 1005  Weight: 184 lb 4.8 oz (83.6 kg)    GENERAL:alert, no distress and comfortable SKIN: skin color, texture, turgor are normal, no rashes or significant lesions EYES: normal, conjunctiva are pink and non-injected, sclera clear OROPHARYNX:no exudate, no erythema and lips, buccal mucosa, and tongue normal  NECK: supple, thyroid normal size, non-tender, without nodularity LYMPH:  no palpable lymphadenopathy in the cervical, axillary or inguinal LUNGS: clear to auscultation and percussion with normal breathing effort HEART: regular rate & rhythm and no murmurs and no lower extremity edema ABDOMEN:abdomen soft, non-tender and normal bowel sounds Musculoskeletal:no cyanosis of digits and no clubbing  PSYCH: alert & oriented x 3 with fluent speech NEURO: no focal motor/sensory deficits  LABORATORY  DATA:  I have reviewed the data as listed Recent Results (from the past 2160 hour(s))  CBC With Differential     Status: Abnormal   Collection Time: 12/17/17  3:33 PM  Result Value Ref Range   WBC 10.8 3.4 - 10.8 x10E3/uL   RBC 4.71 4.14 - 5.80 x10E6/uL   Hemoglobin 14.9 13.0 - 17.7 g/dL   Hematocrit 42.5 37.5 - 51.0 %   MCV 90 79 - 97 fL   MCH 31.6 26.6 - 33.0 pg   MCHC 35.1 31.5 - 35.7 g/dL   RDW 12.8 12.3 - 15.4 %    Comment: **Effective January 14, 2018, the RDW pediatric reference**   interval will be removed and the adult reference interval   will be changing to:  Male 11.7 - 15.4                                                      Male 11.6 - 15.4    Neutrophils 53 Not Estab. %   Lymphs 37 Not Estab. %   Monocytes 7 Not Estab. %   Eos 3 Not Estab. %   Basos 0 Not Estab. %   Neutrophils Absolute 5.6 1.4 - 7.0 x10E3/uL   Lymphocytes Absolute 4.0 (H) 0.7 - 3.1 x10E3/uL   Monocytes Absolute 0.8 0.1 - 0.9 x10E3/uL   EOS (ABSOLUTE) 0.4 0.0 - 0.4 x10E3/uL   Basophils Absolute 0.0 0.0 - 0.2 x10E3/uL   Immature Granulocytes 0 Not Estab. %   Immature Grans (Abs) 0.0 0.0 - 0.1 x10E3/uL  Comp panel: Leukemia/Lymphoma     Status: None   Collection Time: 12/24/17  4:16 PM  Result Value Ref Range   PATH INTERP XXX-IMP Comment     Comment: CD5-, CD10-, CD23- clonal B-cell populatin, non-specific phenotype, 9% of leukocytes, <5000/uL  See Comment    ANNOTATION COMMENT IMP Comment     Comment: A typical phenotype for chronic lymphocytic leukemia/small lymphocytic lymphoma, mantle cell lymphoma, hairy cell leukemia, or follicular center cell lymphoma is not detected. If the patient is not known to have a B-cell leukemia or lymphoma, further evaluation is recommended if clinically indicated   In the absence of B-cell leukemia or lymphoma, small (<5000/uL) clonal B-cell populations in the blood are classified as monoclonal B-cell lymphocytosis.  Clinical correlation is required.    CLINICAL INFO Comment     Comment: Accompanying CBC dated 12/17/17 shows: WBC 10.8, Neu 5.6, Lym 4.0, Mon 0.8.   Specimen Type Comment     Comment: Peripheral blood   ASSESSMENT OF LEUKOCYTES Comment     Comment: A CD5-, CD10-, CD23-, CD103-, FMC7+   monoclonal B cell population is detected with lambda light chain restriction representing 9% of the leukocytes. There is no loss of, or aberrant expression of, the pan T cell antigens to suggest a neoplastic T cell process. CD4:CD8 ratio 3.5 No circulating blasts are detected. There is no immunophenotypic  evidence of abnormal myeloid maturation. Analysis of the leukocyte population shows: granulocytes 60%, monocytes 7%, lymphocytes 33%, blasts <0.5%, B cells 10%, T cells 18%, NK cells 5%.    % Viable Cells Comment     Comment: 97%   Immunophenotypic Profile Comment     Comment: Abnormal cell population: present-9% of total cells (Phenotype below)   ANALYSIS AND GATING STRATEGY Comment     Comment: 8 color analysis with CD45/SSC gating   IMMUNOPHENOTYPING STUDY Comment     Comment: CD2       (-)            CD3       (-) CD4       (-)            CD5       (-) CD7       (-)            CD8       (-) CD10      (-)            CD11b     (-) CD11c     (+) Dim  CD13      (-) CD14      (-)            CD15      (-) CD16      (-)            CD19      (+) CD20      (+)            CD22      (+) CD23      (-)            CD33      (-) CD34      (-)            CD38      (-) CD45      (+)            CD56      (-) CD57      (-)            CD103     (-) CD117     (-)            FMC-7     (+) HLA-DR    (+)            KAPPA     (-) LAMBDA    (+)            CD64      (-)    PATHOLOGIST NAME Comment     Comment: Gaston Islam, M.D.   COMMENT: Comment     Comment: Each antibody in this assay was utilized to assess for potential abnormalities of studied cell populations or to characterize identified  abnormalities. This test was developed and its performance characteristics determined by [facility].  It has not been cleared or approved by the U.S. Food and Drug Administration. The FDA has determined that such clearance or approval is not necessary. This test is used for clinical purposes.  It should not be regarded as investigational or for research.     RADIOGRAPHIC STUDIES: I have personally reviewed the radiological images as listed and agreed with the findings in the report. No results found.  ASSESSMENT & PLAN  Monoclonal B-cell lymphocytosis of undetermined significance We discussed the natural history of monoclonal B cell lymphocytosis of unknown significance He is not symptomatic His shortness of breath is unrelated We discussed the need for once a year visit to continue follow-up on lymphocytosis He is educated to watch out for signs and symptoms of disease such as unexplained lymphadenopathy and unresolved infection.  Alcohol consumption heavy I noted heavy alcohol consumption Due to potential depressed immune system with MBUS, I recommend reducing his alcohol intake to less than 2 drinks per day  Dyspnea on exertion He had recent dyspnea on exertion which is unrelated to his blood disorder If in doubt, I recommend consideration for pulmonologist evaluation.   Orders Placed This Encounter  Procedures  . CBC with Differential/Platelet    Standing Status:   Future    Standing Expiration Date:   03/12/2019    All questions were answered. The patient knows to call the clinic with any problems, questions or concerns. I spent 40 minutes counseling the patient face to face. The total time spent in the appointment was 50 minutes and more than 50% was on counseling.     Heath Lark, MD 02/05/2018 12:12 PM

## 2018-02-05 NOTE — Telephone Encounter (Signed)
Gave avs and calendar ° °

## 2018-02-05 NOTE — Assessment & Plan Note (Signed)
We discussed the natural history of monoclonal B cell lymphocytosis of unknown significance He is not symptomatic His shortness of breath is unrelated We discussed the need for once a year visit to continue follow-up on lymphocytosis He is educated to watch out for signs and symptoms of disease such as unexplained lymphadenopathy and unresolved infection.

## 2018-02-05 NOTE — Assessment & Plan Note (Signed)
I noted heavy alcohol consumption Due to potential depressed immune system with MBUS, I recommend reducing his alcohol intake to less than 2 drinks per day

## 2018-02-05 NOTE — Assessment & Plan Note (Signed)
He had recent dyspnea on exertion which is unrelated to his blood disorder If in doubt, I recommend consideration for pulmonologist evaluation.

## 2018-03-04 NOTE — Telephone Encounter (Signed)
Received a MCR denial form stating the Dx code R79.89  was not appropriate for them to properly execute for the patient to be covered on his or her labs. Please provide proper Dx code. Patient is seeing an Materials engineer. Will place letter on Dr. Neldon Mc office.

## 2018-03-04 NOTE — Telephone Encounter (Signed)
Please use : Monoclonal B-cell lymphocytosis of undetermined significance

## 2018-03-04 NOTE — Telephone Encounter (Signed)
Dx code was changed on form and faxed to [phone]

## 2018-07-16 DIAGNOSIS — D2262 Melanocytic nevi of left upper limb, including shoulder: Secondary | ICD-10-CM | POA: Diagnosis not present

## 2018-07-16 DIAGNOSIS — L570 Actinic keratosis: Secondary | ICD-10-CM | POA: Diagnosis not present

## 2018-07-16 DIAGNOSIS — D225 Melanocytic nevi of trunk: Secondary | ICD-10-CM | POA: Diagnosis not present

## 2018-07-16 DIAGNOSIS — L821 Other seborrheic keratosis: Secondary | ICD-10-CM | POA: Diagnosis not present

## 2018-07-16 DIAGNOSIS — D1801 Hemangioma of skin and subcutaneous tissue: Secondary | ICD-10-CM | POA: Diagnosis not present

## 2018-07-16 DIAGNOSIS — D692 Other nonthrombocytopenic purpura: Secondary | ICD-10-CM | POA: Diagnosis not present

## 2018-07-16 DIAGNOSIS — Z85828 Personal history of other malignant neoplasm of skin: Secondary | ICD-10-CM | POA: Diagnosis not present

## 2018-07-16 DIAGNOSIS — D2261 Melanocytic nevi of right upper limb, including shoulder: Secondary | ICD-10-CM | POA: Diagnosis not present

## 2018-07-16 DIAGNOSIS — D2271 Melanocytic nevi of right lower limb, including hip: Secondary | ICD-10-CM | POA: Diagnosis not present

## 2018-08-13 DIAGNOSIS — Z86010 Personal history of colonic polyps: Secondary | ICD-10-CM | POA: Diagnosis not present

## 2018-08-13 DIAGNOSIS — Z0000 Encounter for general adult medical examination without abnormal findings: Secondary | ICD-10-CM | POA: Diagnosis not present

## 2018-08-13 DIAGNOSIS — E7800 Pure hypercholesterolemia, unspecified: Secondary | ICD-10-CM | POA: Diagnosis not present

## 2018-08-13 DIAGNOSIS — N529 Male erectile dysfunction, unspecified: Secondary | ICD-10-CM | POA: Diagnosis not present

## 2018-08-13 DIAGNOSIS — J309 Allergic rhinitis, unspecified: Secondary | ICD-10-CM | POA: Diagnosis not present

## 2018-08-13 DIAGNOSIS — N401 Enlarged prostate with lower urinary tract symptoms: Secondary | ICD-10-CM | POA: Diagnosis not present

## 2018-08-13 DIAGNOSIS — Z1389 Encounter for screening for other disorder: Secondary | ICD-10-CM | POA: Diagnosis not present

## 2018-08-13 DIAGNOSIS — G479 Sleep disorder, unspecified: Secondary | ICD-10-CM | POA: Diagnosis not present

## 2018-08-13 DIAGNOSIS — Z125 Encounter for screening for malignant neoplasm of prostate: Secondary | ICD-10-CM | POA: Diagnosis not present

## 2018-08-15 DIAGNOSIS — G479 Sleep disorder, unspecified: Secondary | ICD-10-CM | POA: Diagnosis not present

## 2018-08-15 DIAGNOSIS — E7800 Pure hypercholesterolemia, unspecified: Secondary | ICD-10-CM | POA: Diagnosis not present

## 2018-08-15 DIAGNOSIS — N529 Male erectile dysfunction, unspecified: Secondary | ICD-10-CM | POA: Diagnosis not present

## 2018-08-15 DIAGNOSIS — J309 Allergic rhinitis, unspecified: Secondary | ICD-10-CM | POA: Diagnosis not present

## 2018-08-15 DIAGNOSIS — Z86010 Personal history of colonic polyps: Secondary | ICD-10-CM | POA: Diagnosis not present

## 2018-08-15 DIAGNOSIS — N401 Enlarged prostate with lower urinary tract symptoms: Secondary | ICD-10-CM | POA: Diagnosis not present

## 2018-08-15 DIAGNOSIS — Z125 Encounter for screening for malignant neoplasm of prostate: Secondary | ICD-10-CM | POA: Diagnosis not present

## 2018-08-15 DIAGNOSIS — Z0000 Encounter for general adult medical examination without abnormal findings: Secondary | ICD-10-CM | POA: Diagnosis not present

## 2018-09-02 ENCOUNTER — Other Ambulatory Visit: Payer: Self-pay | Admitting: *Deleted

## 2018-09-02 MED ORDER — MONTELUKAST SODIUM 10 MG PO TABS: ORAL_TABLET | ORAL | 0 refills | Status: DC

## 2018-09-26 ENCOUNTER — Other Ambulatory Visit: Payer: Self-pay | Admitting: Allergy and Immunology

## 2018-10-01 ENCOUNTER — Encounter: Payer: Self-pay | Admitting: Allergy and Immunology

## 2018-10-01 ENCOUNTER — Other Ambulatory Visit: Payer: Self-pay

## 2018-10-01 ENCOUNTER — Ambulatory Visit (INDEPENDENT_AMBULATORY_CARE_PROVIDER_SITE_OTHER): Payer: Medicare Other | Admitting: Allergy and Immunology

## 2018-10-01 VITALS — BP 122/62 | HR 93 | Temp 97.2°F | Resp 16 | Ht 74.0 in | Wt 184.2 lb

## 2018-10-01 DIAGNOSIS — R0600 Dyspnea, unspecified: Secondary | ICD-10-CM

## 2018-10-01 DIAGNOSIS — R0609 Other forms of dyspnea: Secondary | ICD-10-CM | POA: Diagnosis not present

## 2018-10-01 DIAGNOSIS — D72820 Lymphocytosis (symptomatic): Secondary | ICD-10-CM | POA: Diagnosis not present

## 2018-10-01 DIAGNOSIS — J3089 Other allergic rhinitis: Secondary | ICD-10-CM | POA: Diagnosis not present

## 2018-10-01 NOTE — Patient Instructions (Addendum)
  1. Continue Nasacort - one spray each nostril 3-7 times per week.    2. Continue Montelukast 10 mg tablet - one tablet one time per day  3. If needed:   A. cetirizine 10 mg tablet 1 time per day  B. Nasal saline  C. Mucinex DM 2 tablets twice a day  4. Engage in progressive aerobic exercise program  5. Return to clinic in 12 months or earlier if problem  6. Visit with Hematologist January 2021 about B cell lymphocytosis

## 2018-10-01 NOTE — Progress Notes (Signed)
[place]   Follow-up Note  Referring Provider: Cari Caraway, MD Primary Provider: Cari Caraway, MD Date of Office Visit: 10/01/2018  Subjective:   Tyler Calderon (DOB: 04-06-1950) is a 68 y.o. male who returns to the [facility] on 10/01/2018 in re-evaluation of the following:  HPI: Tyler Calderon returns to this clinic in evaluation of allergic rhinitis and shortness of breath.  I last saw him in this clinic on 21 August 2017.  During his investigation for dyspnea on exertion we obtained blood tests which identified an expanded lymphocyte population which was further defined by immunophenotyping which turned out to be a monoclonal CD5 negative B-cell lymphocytosis of unknown significance.  We referred him to hematology, Dr. Lottie Rater, who will follow him in her clinic for this abnormality.  As well, we obtained a stress test in investigation of his dyspnea on exertion which turned out to be a normal study.  He still occasionally has this issue especially if he walks up stairs or carries some items.  His recovery time is just seconds.  He has no associated coughing or wheezing.  He does walk on a treadmill a few times a week for about an hour without any problem but he does not increase his heart rate very much during this activity.  His nose is doing wonderful on a combination of a nasal steroid and leukotriene modifier.  He has not required a systemic steroid or antibiotic for any type of airway issue during the interval.  Allergies as of 10/01/2018   No Known Allergies     Medication List      ASPIRIN 81 PO Take by mouth daily.   LORazepam 0.5 MG tablet Commonly known as: ATIVAN 1-2 TABS BEFORE BEDTIME AS NEEDED ORALLY 30 DAYS   montelukast 10 MG tablet Commonly known as: SINGULAIR TAKE 1 TABLET BY MOUTH EVERYDAY AT BEDTIME   MULTIVITAMIN GUMMIES ADULT PO Take by mouth daily.   Nasacort Allergy 24HR 55 MCG/ACT Aero  nasal inhaler Generic drug: triamcinolone Use one spray in each nostril once daily as directed.   simvastatin 20 MG tablet Commonly known as: ZOCOR Take 20 mg by mouth daily.   tamsulosin 0.4 MG Caps capsule Commonly known as: FLOMAX       Past Medical History:  Diagnosis Date  . Shingles     Past Surgical History:  Procedure Laterality Date  . LAMINECTOMY  1980s   Two surgeries  . SHOULDER ARTHROSCOPY WITH LABRAL REPAIR Right    47    Review of systems negative except as noted in HPI / PMHx or noted below:  Review of Systems  Constitutional: Negative.   HENT: Negative.   Eyes: Negative.   Respiratory: Negative.   Cardiovascular: Negative.   Gastrointestinal: Negative.   Genitourinary: Negative.   Musculoskeletal: Negative.   Skin: Negative.   Neurological: Negative.   Endo/Heme/Allergies: Negative.   Psychiatric/Behavioral: Negative.      Objective:   Vitals:   10/01/18 1133  BP: 122/62  Pulse: 93  Resp: 16  Temp: (!) 97.2 F (36.2 C)  SpO2: 96%   Height: 6\' 2"  (188 cm)  Weight: 184 lb 3.2 oz (83.6 kg)   Physical Exam Constitutional:      Appearance: He is not diaphoretic.  HENT:     Head: Normocephalic.     Right Ear: Tympanic membrane, ear canal and external ear normal.     Left Ear: Tympanic membrane, ear  canal and external ear normal.     Nose: Nose normal. No mucosal edema or rhinorrhea.     Mouth/Throat:     Pharynx: Uvula midline. No oropharyngeal exudate.  Eyes:     Conjunctiva/sclera: Conjunctivae normal.  Neck:     Thyroid: No thyromegaly.     Trachea: Trachea normal. No tracheal tenderness or tracheal deviation.  Cardiovascular:     Rate and Rhythm: Normal rate and regular rhythm.     Heart sounds: Normal heart sounds, S1 normal and S2 normal. No murmur.  Pulmonary:     Effort: No respiratory distress.     Breath sounds: Normal breath sounds. No stridor. No wheezing or rales.  Lymphadenopathy:     Head:     Right side of  head: No tonsillar adenopathy.     Left side of head: No tonsillar adenopathy.     Cervical: No cervical adenopathy.  Skin:    Findings: No erythema or rash.     Nails: There is no clubbing.   Neurological:     Mental Status: He is alert.     Diagnostics:    Spirometry was performed and demonstrated an FEV1 of 3.58 at 92 % of predicted.  Results of blood tests obtained 17 December 2017 identified WBC 10.8, absolute lymphocyte 4000, absolute eosinophil 400, hemoglobin 14.9  Results of blood tests obtained 24 December 2017 identified a monoclonal CD5 negative B-cell population of less than 5000/UL.  Assessment and Plan:   1. Other allergic rhinitis   2. Dyspnea on exertion   3. Monoclonal B-cell lymphocytosis of undetermined significance     1. Continue Nasacort - one spray each nostril 3-7 times per week.    2. Continue Montelukast 10 mg tablet - one tablet one time per day  3. If needed:   A. cetirizine 10 mg tablet 1 time per day  B. Nasal saline  C. Mucinex DM 2 tablets twice a day  4. Engage in progressive aerobic exercise program  5. Return to clinic in 12 months or earlier if problem  6. Visit with Hematologist January 2021 about B cell lymphocytosis   Tyler Calderon appears to be doing very well with his allergic rhinitis on his current plan.  I suspect that there is some degree of cardiac deconditioning giving rise to some of his dyspnea on exertion and I had a talk with him today about undergoing interval training to raise his heart rate while he exercises.  If he still continues to have problems with dyspnea on exertion then I recommend that he have further evaluation with his primary care doctor for this issue.  He will follow-up with hematology regarding his monoclonal B-cell population.  Allena Katz, MD Allergy / Immunology [place]

## 2018-10-02 ENCOUNTER — Encounter: Payer: Self-pay | Admitting: Allergy and Immunology

## 2018-10-02 NOTE — Addendum Note (Signed)
Addended by: Neomia Dear on: 10/02/2018 04:51 PM   Modules accepted: Orders

## 2018-10-07 DIAGNOSIS — Z23 Encounter for immunization: Secondary | ICD-10-CM | POA: Diagnosis not present

## 2018-10-15 ENCOUNTER — Other Ambulatory Visit: Payer: Self-pay

## 2018-10-15 MED ORDER — MONTELUKAST SODIUM 10 MG PO TABS: ORAL_TABLET | ORAL | 4 refills | Status: DC

## 2018-10-15 MED ORDER — MONTELUKAST SODIUM 10 MG PO TABS: ORAL_TABLET | ORAL | 3 refills | Status: DC

## 2018-11-01 DIAGNOSIS — E7800 Pure hypercholesterolemia, unspecified: Secondary | ICD-10-CM | POA: Diagnosis not present

## 2018-11-01 DIAGNOSIS — N400 Enlarged prostate without lower urinary tract symptoms: Secondary | ICD-10-CM | POA: Diagnosis not present

## 2018-11-01 DIAGNOSIS — N401 Enlarged prostate with lower urinary tract symptoms: Secondary | ICD-10-CM | POA: Diagnosis not present

## 2019-02-06 ENCOUNTER — Other Ambulatory Visit: Payer: Self-pay

## 2019-02-06 ENCOUNTER — Encounter: Payer: Self-pay | Admitting: Hematology and Oncology

## 2019-02-06 ENCOUNTER — Inpatient Hospital Stay (HOSPITAL_BASED_OUTPATIENT_CLINIC_OR_DEPARTMENT_OTHER): Payer: Medicare Other | Admitting: Hematology and Oncology

## 2019-02-06 ENCOUNTER — Inpatient Hospital Stay: Payer: Medicare Other | Attending: Hematology and Oncology

## 2019-02-06 DIAGNOSIS — E7800 Pure hypercholesterolemia, unspecified: Secondary | ICD-10-CM | POA: Diagnosis not present

## 2019-02-06 DIAGNOSIS — Z7189 Other specified counseling: Secondary | ICD-10-CM | POA: Diagnosis not present

## 2019-02-06 DIAGNOSIS — N400 Enlarged prostate without lower urinary tract symptoms: Secondary | ICD-10-CM | POA: Diagnosis not present

## 2019-02-06 DIAGNOSIS — Z789 Other specified health status: Secondary | ICD-10-CM

## 2019-02-06 DIAGNOSIS — Z79899 Other long term (current) drug therapy: Secondary | ICD-10-CM | POA: Insufficient documentation

## 2019-02-06 DIAGNOSIS — R0602 Shortness of breath: Secondary | ICD-10-CM | POA: Insufficient documentation

## 2019-02-06 DIAGNOSIS — Z20822 Contact with and (suspected) exposure to covid-19: Secondary | ICD-10-CM | POA: Diagnosis not present

## 2019-02-06 DIAGNOSIS — N401 Enlarged prostate with lower urinary tract symptoms: Secondary | ICD-10-CM | POA: Diagnosis not present

## 2019-02-06 DIAGNOSIS — D72820 Lymphocytosis (symptomatic): Secondary | ICD-10-CM | POA: Insufficient documentation

## 2019-02-06 LAB — CBC WITH DIFFERENTIAL/PLATELET
Abs Immature Granulocytes: 0.02 10*3/uL (ref 0.00–0.07)
Basophils Absolute: 0.1 10*3/uL (ref 0.0–0.1)
Basophils Relative: 1 %
Eosinophils Absolute: 0.5 10*3/uL (ref 0.0–0.5)
Eosinophils Relative: 4 %
HCT: 44 % (ref 39.0–52.0)
Hemoglobin: 15.1 g/dL (ref 13.0–17.0)
Immature Granulocytes: 0 %
Lymphocytes Relative: 46 %
Lymphs Abs: 4.9 10*3/uL — ABNORMAL HIGH (ref 0.7–4.0)
MCH: 32 pg (ref 26.0–34.0)
MCHC: 34.3 g/dL (ref 30.0–36.0)
MCV: 93.2 fL (ref 80.0–100.0)
Monocytes Absolute: 0.8 10*3/uL (ref 0.1–1.0)
Monocytes Relative: 8 %
Neutro Abs: 4.4 10*3/uL (ref 1.7–7.7)
Neutrophils Relative %: 41 %
Platelets: 143 10*3/uL — ABNORMAL LOW (ref 150–400)
RBC: 4.72 MIL/uL (ref 4.22–5.81)
RDW: 13.4 % (ref 11.5–15.5)
WBC: 10.7 10*3/uL — ABNORMAL HIGH (ref 4.0–10.5)
nRBC: 0 % (ref 0.0–0.2)

## 2019-02-07 ENCOUNTER — Encounter: Payer: Self-pay | Admitting: Hematology and Oncology

## 2019-02-07 ENCOUNTER — Telehealth: Payer: Self-pay | Admitting: Hematology and Oncology

## 2019-02-07 NOTE — Assessment & Plan Note (Signed)
I noted heavy alcohol consumption Due to potential depressed immune system with MBUS, I recommend to consider stop drinking alcohol.

## 2019-02-07 NOTE — Assessment & Plan Note (Signed)
We discussed the natural history of monoclonal B cell lymphocytosis of unknown significance He is not symptomatic His shortness of breath is unrelated We discussed the need for once a year visit to continue follow-up on lymphocytosis He is educated to watch out for signs and symptoms of disease such as unexplained lymphadenopathy and unresolved infection.

## 2019-02-07 NOTE — Progress Notes (Signed)
[facility] OFFICE PROGRESS NOTE  Patient Care Team: Cari Caraway, MD as PCP - General (Family Medicine)  ASSESSMENT & PLAN:  Monoclonal B-cell lymphocytosis of undetermined significance We discussed the natural history of monoclonal B cell lymphocytosis of unknown significance He is not symptomatic His shortness of breath is unrelated We discussed the need for once a year visit to continue follow-up on lymphocytosis He is educated to watch out for signs and symptoms of disease such as unexplained lymphadenopathy and unresolved infection.  Alcohol consumption heavy I noted heavy alcohol consumption Due to potential depressed immune system with MBUS, I recommend to consider stop drinking alcohol.   No orders of the defined types were placed in this encounter.   All questions were answered. The patient knows to call the clinic with any problems, questions or concerns. The total time spent in the appointment was 15 minutes encounter with patients including review of chart and various tests results, discussions about plan of care and coordination of care plan   Heath Lark, MD 02/07/2019 7:42 AM  INTERVAL HISTORY: Please see below for problem oriented charting. He returns with his wife for further follow-up He is feeling well No recent lymphadenopathy or recurrent infection Appetite is fair He is up-to-date with age-appropriate screening programs  SUMMARY OF ONCOLOGIC HISTORY:  Tyler Calderon is here because of elevated lymphocyte count. He is accompanied by his wife, Tyler Calderon The patient is a retired Dance movement psychotherapist.  He does not smoke. He has chronic history of sinusitis/allergies and is followed by an allergist Most recently, he complained of shortness of breath on exertion.  He underwent cardiac evaluation including a negative treadmill test  On his blood work, he was noted to have mild lymphocytosis.  Flow cytometry detected monoclonal B-cell population  less than 5000, nonspecific phenotype.  He was found to have abnormal CBC from his blood works in September 2019.  The total WBC is within normal limits.  The total absolute lymphocyte count is less than 5000 He denies recent infection. The last prescription antibiotics was more than 3 months ago There is not reported symptoms of urinary frequency/urgency or dysuria, diarrhea, joint swelling/pain or abnormal skin rash.  He had no prior history or diagnosis of cancer. His age appropriate screening programs are up-to-date. The patient has no prior diagnosis of autoimmune disease and was not prescribed corticosteroids related products.  REVIEW OF SYSTEMS:   Constitutional: Denies fevers, chills or abnormal weight loss Eyes: Denies blurriness of vision Ears, nose, mouth, throat, and face: Denies mucositis or sore throat Respiratory: Denies cough, dyspnea or wheezes Cardiovascular: Denies palpitation, chest discomfort or lower extremity swelling Gastrointestinal:  Denies nausea, heartburn or change in bowel habits Skin: Denies abnormal skin rashes Lymphatics: Denies new lymphadenopathy or easy bruising Neurological:Denies numbness, tingling or new weaknesses Behavioral/Psych: Mood is stable, no new changes  All other systems were reviewed with the patient and are negative.  I have reviewed the past medical history, past surgical history, social history and family history with the patient and they are unchanged from previous note.  ALLERGIES:  has No Known Allergies.  MEDICATIONS:  Current Outpatient Medications  Medication Sig Dispense Refill  . ASPIRIN 81 PO Take by mouth daily.    Marland Kitchen LORazepam (ATIVAN) 0.5 MG tablet 1-2 TABS BEFORE BEDTIME AS NEEDED ORALLY 30 DAYS  0  . montelukast (SINGULAIR) 10 MG tablet TAKE 1 TABLET BY MOUTH EVERYDAY AT BEDTIME 90 tablet 4  . Multiple Vitamins-Minerals (MULTIVITAMIN GUMMIES ADULT PO)  Take by mouth daily.    Marland Kitchen NASACORT ALLERGY 24HR 55 MCG/ACT AERO  nasal inhaler Use one spray in each nostril once daily as directed.    . simvastatin (ZOCOR) 20 MG tablet Take 20 mg by mouth daily.     . tamsulosin (FLOMAX) 0.4 MG CAPS capsule      No current facility-administered medications for this visit.    PHYSICAL EXAMINATION: ECOG PERFORMANCE STATUS: 0 - Asymptomatic  Vitals:   02/06/19 1327  BP: 140/75  Pulse: 83  Resp: 18  Temp: (!) 97.3 F (36.3 C)  SpO2: 99%   Filed Weights   02/06/19 1327  Weight: 188 lb 12.8 oz (85.6 kg)    GENERAL:alert, no distress and comfortable SKIN: skin color, texture, turgor are normal, no rashes or significant lesions EYES: normal, Conjunctiva are pink and non-injected, sclera clear OROPHARYNX:no exudate, no erythema and lips, buccal mucosa, and tongue normal  NECK: supple, thyroid normal size, non-tender, without nodularity LYMPH:  no palpable lymphadenopathy in the cervical, axillary or inguinal LUNGS: clear to auscultation and percussion with normal breathing effort HEART: regular rate & rhythm and no murmurs and no lower extremity edema ABDOMEN:abdomen soft, non-tender and normal bowel sounds Musculoskeletal:no cyanosis of digits and no clubbing  NEURO: alert & oriented x 3 with fluent speech, no focal motor/sensory deficits  LABORATORY DATA:  I have reviewed the data as listed No results found for: NA, K, CL, CO2, GLUCOSE, BUN, CREATININE, CALCIUM, PROT, ALBUMIN, AST, ALT, ALKPHOS, BILITOT, GFRNONAA, GFRAA  No results found for: SPEP, UPEP  Lab Results  Component Value Date   WBC 10.7 (H) 02/06/2019   NEUTROABS 4.4 02/06/2019   HGB 15.1 02/06/2019   HCT 44.0 02/06/2019   MCV 93.2 02/06/2019   PLT 143 (L) 02/06/2019      Chemistry   No results found for: NA, K, CL, CO2, BUN, CREATININE, GLU No results found for: CALCIUM, ALKPHOS, AST, ALT, BILITOT

## 2019-02-07 NOTE — Telephone Encounter (Signed)
I talk with patient regarding schedule  

## 2019-04-15 DIAGNOSIS — N400 Enlarged prostate without lower urinary tract symptoms: Secondary | ICD-10-CM | POA: Diagnosis not present

## 2019-04-15 DIAGNOSIS — N401 Enlarged prostate with lower urinary tract symptoms: Secondary | ICD-10-CM | POA: Diagnosis not present

## 2019-04-15 DIAGNOSIS — E7800 Pure hypercholesterolemia, unspecified: Secondary | ICD-10-CM | POA: Diagnosis not present

## 2019-05-30 DIAGNOSIS — R319 Hematuria, unspecified: Secondary | ICD-10-CM | POA: Diagnosis not present

## 2019-05-30 DIAGNOSIS — Z125 Encounter for screening for malignant neoplasm of prostate: Secondary | ICD-10-CM | POA: Diagnosis not present

## 2019-06-02 ENCOUNTER — Other Ambulatory Visit: Payer: Self-pay | Admitting: Family Medicine

## 2019-06-02 DIAGNOSIS — R319 Hematuria, unspecified: Secondary | ICD-10-CM

## 2019-06-19 ENCOUNTER — Ambulatory Visit
Admission: RE | Admit: 2019-06-19 | Discharge: 2019-06-19 | Disposition: A | Discharge status: Home / Self Care | Payer: Medicare Other | Source: Ambulatory Visit | Attending: Family Medicine | Admitting: Family Medicine

## 2019-06-19 DIAGNOSIS — R319 Hematuria, unspecified: Secondary | ICD-10-CM

## 2019-06-19 DIAGNOSIS — R310 Gross hematuria: Secondary | ICD-10-CM | POA: Diagnosis not present

## 2019-06-19 MED ORDER — IOPAMIDOL (ISOVUE-300) INJECTION 61%
100.0000 mL | Freq: Once | INTRAVENOUS | Status: AC | PRN
  Administered 2019-06-19: 100 mL via INTRAVENOUS

## 2019-07-17 DIAGNOSIS — R310 Gross hematuria: Secondary | ICD-10-CM | POA: Diagnosis not present

## 2019-07-22 ENCOUNTER — Other Ambulatory Visit: Payer: Self-pay | Admitting: Urology

## 2019-07-22 DIAGNOSIS — N401 Enlarged prostate with lower urinary tract symptoms: Secondary | ICD-10-CM | POA: Diagnosis not present

## 2019-07-22 DIAGNOSIS — E7800 Pure hypercholesterolemia, unspecified: Secondary | ICD-10-CM | POA: Diagnosis not present

## 2019-07-22 DIAGNOSIS — N400 Enlarged prostate without lower urinary tract symptoms: Secondary | ICD-10-CM | POA: Diagnosis not present

## 2019-08-04 DIAGNOSIS — D2261 Melanocytic nevi of right upper limb, including shoulder: Secondary | ICD-10-CM | POA: Diagnosis not present

## 2019-08-04 DIAGNOSIS — D2271 Melanocytic nevi of right lower limb, including hip: Secondary | ICD-10-CM | POA: Diagnosis not present

## 2019-08-04 DIAGNOSIS — D1801 Hemangioma of skin and subcutaneous tissue: Secondary | ICD-10-CM | POA: Diagnosis not present

## 2019-08-04 DIAGNOSIS — L821 Other seborrheic keratosis: Secondary | ICD-10-CM | POA: Diagnosis not present

## 2019-08-04 DIAGNOSIS — D2272 Melanocytic nevi of left lower limb, including hip: Secondary | ICD-10-CM | POA: Diagnosis not present

## 2019-08-04 DIAGNOSIS — D225 Melanocytic nevi of trunk: Secondary | ICD-10-CM | POA: Diagnosis not present

## 2019-08-04 DIAGNOSIS — D2262 Melanocytic nevi of left upper limb, including shoulder: Secondary | ICD-10-CM | POA: Diagnosis not present

## 2019-08-04 DIAGNOSIS — Z85828 Personal history of other malignant neoplasm of skin: Secondary | ICD-10-CM | POA: Diagnosis not present

## 2019-08-11 DIAGNOSIS — T63441A Toxic effect of venom of bees, accidental (unintentional), initial encounter: Secondary | ICD-10-CM

## 2019-08-11 HISTORY — DX: Toxic effect of venom of bees, accidental (unintentional), initial encounter: T63.441A

## 2019-08-13 ENCOUNTER — Encounter (HOSPITAL_BASED_OUTPATIENT_CLINIC_OR_DEPARTMENT_OTHER): Payer: Self-pay | Admitting: Urology

## 2019-08-13 ENCOUNTER — Other Ambulatory Visit: Payer: Self-pay

## 2019-08-13 NOTE — Progress Notes (Signed)
Spoke w/ via phone for pre-op interview---PT Lab needs dos----    none           Lab results------exercise treadmill 11-27-2017 epic COVID test ------ Arrive at -------08-19-2019 at 1500 pm NPO after MN NO Solid Food.  Clear liquids from MN until---900 am then npo Medications to take morning of surgery -----nasacort Diabetic medication -----n/a Patient Special Instructions -----none Pre-Op special Istructions -----none Patient verbalized understanding of instructions that were given at this phone interview. Patient denies shortness of breath, chest pain, fever, cough at this phone interview.

## 2019-08-19 ENCOUNTER — Other Ambulatory Visit (HOSPITAL_COMMUNITY)
Admission: RE | Admit: 2019-08-19 | Discharge: 2019-08-19 | Disposition: A | Discharge status: Home / Self Care | Payer: Medicare Other | Source: Ambulatory Visit | Attending: Urology | Admitting: Urology

## 2019-08-19 DIAGNOSIS — Z01812 Encounter for preprocedural laboratory examination: Secondary | ICD-10-CM | POA: Insufficient documentation

## 2019-08-19 DIAGNOSIS — Z20822 Contact with and (suspected) exposure to covid-19: Secondary | ICD-10-CM | POA: Insufficient documentation

## 2019-08-19 LAB — SARS CORONAVIRUS 2 (TAT 6-24 HRS): SARS Coronavirus 2: NEGATIVE

## 2019-08-22 ENCOUNTER — Ambulatory Visit (HOSPITAL_BASED_OUTPATIENT_CLINIC_OR_DEPARTMENT_OTHER)
Admission: RE | Admit: 2019-08-22 | Discharge: 2019-08-22 | Disposition: A | Discharge status: Home / Self Care | Payer: Medicare Other | Attending: Urology | Admitting: Urology

## 2019-08-22 ENCOUNTER — Ambulatory Visit (HOSPITAL_BASED_OUTPATIENT_CLINIC_OR_DEPARTMENT_OTHER): Payer: Medicare Other | Admitting: Anesthesiology

## 2019-08-22 ENCOUNTER — Encounter (HOSPITAL_BASED_OUTPATIENT_CLINIC_OR_DEPARTMENT_OTHER)
Admission: RE | Disposition: A | Discharge status: Home / Self Care | Payer: Self-pay | Source: Home / Self Care | Attending: Urology

## 2019-08-22 ENCOUNTER — Encounter (HOSPITAL_BASED_OUTPATIENT_CLINIC_OR_DEPARTMENT_OTHER): Payer: Self-pay | Admitting: Urology

## 2019-08-22 ENCOUNTER — Other Ambulatory Visit: Payer: Self-pay

## 2019-08-22 DIAGNOSIS — R310 Gross hematuria: Secondary | ICD-10-CM | POA: Insufficient documentation

## 2019-08-22 DIAGNOSIS — D494 Neoplasm of unspecified behavior of bladder: Secondary | ICD-10-CM | POA: Diagnosis not present

## 2019-08-22 DIAGNOSIS — Z7982 Long term (current) use of aspirin: Secondary | ICD-10-CM | POA: Diagnosis not present

## 2019-08-22 DIAGNOSIS — Z8249 Family history of ischemic heart disease and other diseases of the circulatory system: Secondary | ICD-10-CM | POA: Insufficient documentation

## 2019-08-22 DIAGNOSIS — C679 Malignant neoplasm of bladder, unspecified: Secondary | ICD-10-CM | POA: Insufficient documentation

## 2019-08-22 DIAGNOSIS — Z87891 Personal history of nicotine dependence: Secondary | ICD-10-CM | POA: Diagnosis not present

## 2019-08-22 DIAGNOSIS — D090 Carcinoma in situ of bladder: Secondary | ICD-10-CM | POA: Diagnosis not present

## 2019-08-22 DIAGNOSIS — Z79899 Other long term (current) drug therapy: Secondary | ICD-10-CM | POA: Insufficient documentation

## 2019-08-22 HISTORY — DX: Malignant (primary) neoplasm, unspecified: C80.1

## 2019-08-22 HISTORY — DX: Neoplasm of unspecified behavior of bladder: D49.4

## 2019-08-22 HISTORY — PX: TRANSURETHRAL RESECTION OF BLADDER TUMOR WITH MITOMYCIN-C: SHX6459

## 2019-08-22 SURGERY — TRANSURETHRAL RESECTION OF BLADDER TUMOR WITH MITOMYCIN-C
Anesthesia: General | Site: Bladder

## 2019-08-22 MED ORDER — LIDOCAINE 2% (20 MG/ML) 5 ML SYRINGE
INTRAMUSCULAR | Status: DC | PRN
  Administered 2019-08-22: 80 mg via INTRAVENOUS

## 2019-08-22 MED ORDER — CEFAZOLIN SODIUM-DEXTROSE 2-4 GM/100ML-% IV SOLN
INTRAVENOUS | Status: AC
  Filled 2019-08-22: qty 100

## 2019-08-22 MED ORDER — KETOROLAC TROMETHAMINE 30 MG/ML IJ SOLN
INTRAMUSCULAR | Status: AC
  Filled 2019-08-22: qty 1

## 2019-08-22 MED ORDER — ONDANSETRON HCL 4 MG/2ML IJ SOLN
INTRAMUSCULAR | Status: AC
  Filled 2019-08-22: qty 2

## 2019-08-22 MED ORDER — MIDAZOLAM HCL 5 MG/5ML IJ SOLN
INTRAMUSCULAR | Status: DC | PRN
  Administered 2019-08-22: 2 mg via INTRAVENOUS

## 2019-08-22 MED ORDER — FENTANYL CITRATE (PF) 100 MCG/2ML IJ SOLN
INTRAMUSCULAR | Status: AC
  Filled 2019-08-22: qty 2

## 2019-08-22 MED ORDER — OXYCODONE HCL 5 MG PO TABS: 5.0000 mg | ORAL_TABLET | Freq: Once | ORAL | Status: DC | PRN

## 2019-08-22 MED ORDER — OXYCODONE HCL 5 MG/5ML PO SOLN: 5.0000 mg | Freq: Once | ORAL | Status: DC | PRN

## 2019-08-22 MED ORDER — MIDAZOLAM HCL 2 MG/2ML IJ SOLN
INTRAMUSCULAR | Status: AC
  Filled 2019-08-22: qty 2

## 2019-08-22 MED ORDER — KETOROLAC TROMETHAMINE 30 MG/ML IJ SOLN
30.0000 mg | Freq: Once | INTRAMUSCULAR | Status: AC | PRN
  Administered 2019-08-22: 30 mg via INTRAVENOUS

## 2019-08-22 MED ORDER — LACTATED RINGERS IV SOLN: INTRAVENOUS | Status: DC

## 2019-08-22 MED ORDER — LIDOCAINE 2% (20 MG/ML) 5 ML SYRINGE
INTRAMUSCULAR | Status: AC
  Filled 2019-08-22: qty 5

## 2019-08-22 MED ORDER — DEXAMETHASONE SODIUM PHOSPHATE 4 MG/ML IJ SOLN
INTRAMUSCULAR | Status: DC | PRN
  Administered 2019-08-22: 10 mg via INTRAVENOUS

## 2019-08-22 MED ORDER — TAMSULOSIN HCL 0.4 MG PO CAPS
0.4000 mg | ORAL_CAPSULE | Freq: Once | ORAL | Status: AC
  Administered 2019-08-22: 0.4 mg via ORAL

## 2019-08-22 MED ORDER — GEMCITABINE CHEMO FOR BLADDER INSTILLATION 2000 MG
2000.0000 mg | Freq: Once | INTRAVENOUS | Status: DC
  Filled 2019-08-22: qty 52.6

## 2019-08-22 MED ORDER — PROPOFOL 10 MG/ML IV BOLUS
INTRAVENOUS | Status: DC | PRN
  Administered 2019-08-22: 150 mg via INTRAVENOUS

## 2019-08-22 MED ORDER — DEXAMETHASONE SODIUM PHOSPHATE 10 MG/ML IJ SOLN
INTRAMUSCULAR | Status: AC
  Filled 2019-08-22: qty 1

## 2019-08-22 MED ORDER — SODIUM CHLORIDE 0.9 % IR SOLN
Status: DC | PRN
  Administered 2019-08-22: 1000 mL via INTRAVESICAL
  Administered 2019-08-22: 3000 mL via INTRAVESICAL

## 2019-08-22 MED ORDER — TAMSULOSIN HCL 0.4 MG PO CAPS
ORAL_CAPSULE | ORAL | Status: AC
  Filled 2019-08-22: qty 1

## 2019-08-22 MED ORDER — CEFAZOLIN SODIUM-DEXTROSE 2-4 GM/100ML-% IV SOLN
2.0000 g | INTRAVENOUS | Status: AC
  Administered 2019-08-22: 2 g via INTRAVENOUS

## 2019-08-22 MED ORDER — FENTANYL CITRATE (PF) 100 MCG/2ML IJ SOLN
INTRAMUSCULAR | Status: DC | PRN
  Administered 2019-08-22: 25 ug via INTRAVENOUS
  Administered 2019-08-22: 50 ug via INTRAVENOUS
  Administered 2019-08-22: 25 ug via INTRAVENOUS

## 2019-08-22 MED ORDER — FENTANYL CITRATE (PF) 100 MCG/2ML IJ SOLN
25.0000 ug | INTRAMUSCULAR | Status: DC | PRN
  Administered 2019-08-22: 25 ug via INTRAVENOUS

## 2019-08-22 MED ORDER — HYDROCODONE-ACETAMINOPHEN 5-325 MG PO TABS: 1.0000 | ORAL_TABLET | ORAL | 0 refills | Status: DC | PRN

## 2019-08-22 MED ORDER — PROPOFOL 500 MG/50ML IV EMUL
INTRAVENOUS | Status: AC
  Filled 2019-08-22: qty 50

## 2019-08-22 MED ORDER — ONDANSETRON HCL 4 MG/2ML IJ SOLN
INTRAMUSCULAR | Status: DC | PRN
  Administered 2019-08-22: 4 mg via INTRAVENOUS

## 2019-08-22 MED ORDER — PROPOFOL 10 MG/ML IV BOLUS
INTRAVENOUS | Status: AC
  Filled 2019-08-22: qty 20

## 2019-08-22 SURGICAL SUPPLY — 24 items
BAG DRAIN URO-CYSTO SKYTR STRL (DRAIN) ×2 IMPLANT
BAG URINE DRAIN 2000ML AR STRL (UROLOGICAL SUPPLIES) IMPLANT
BAG URINE LEG 500ML (DRAIN) IMPLANT
CATH FOLEY 2WAY SLVR  5CC 18FR (CATHETERS) ×2
CATH FOLEY 2WAY SLVR  5CC 22FR (CATHETERS)
CATH FOLEY 2WAY SLVR 30CC 20FR (CATHETERS) IMPLANT
CATH FOLEY 2WAY SLVR 5CC 18FR (CATHETERS) ×1 IMPLANT
CATH FOLEY 2WAY SLVR 5CC 22FR (CATHETERS) IMPLANT
CLOTH BEACON ORANGE TIMEOUT ST (SAFETY) ×2 IMPLANT
ELECT REM PT RETURN 9FT ADLT (ELECTROSURGICAL)
ELECTRODE REM PT RTRN 9FT ADLT (ELECTROSURGICAL) IMPLANT
EVACUATOR MICROVAS BLADDER (UROLOGICAL SUPPLIES) IMPLANT
GLOVE BIO SURGEON STRL SZ7.5 (GLOVE) ×2 IMPLANT
GLOVE ECLIPSE 6.5 STRL STRAW (GLOVE) ×2 IMPLANT
GOWN STRL REUS W/ TWL LRG LVL3 (GOWN DISPOSABLE) ×1 IMPLANT
GOWN STRL REUS W/TWL LRG LVL3 (GOWN DISPOSABLE) ×2
IV NS IRRIG 3000ML ARTHROMATIC (IV SOLUTION) ×2 IMPLANT
KIT TURNOVER CYSTO (KITS) ×2 IMPLANT
MANIFOLD NEPTUNE II (INSTRUMENTS) ×2 IMPLANT
PACK CYSTO (CUSTOM PROCEDURE TRAY) ×2 IMPLANT
PLUG CATH AND CAP STER (CATHETERS) ×2 IMPLANT
SYR TOOMEY IRRIG 70ML (MISCELLANEOUS) ×2
SYRINGE TOOMEY IRRIG 70ML (MISCELLANEOUS) ×1 IMPLANT
TUBE CONNECTING 12X1/4 (SUCTIONS) IMPLANT

## 2019-08-22 NOTE — Discharge Instructions (Addendum)
Transurethral Resection of Bladder Tumor (TURBT) or Bladder Biopsy   Definition:  Transurethral Resection of the Bladder Tumor is a surgical procedure used to diagnose and remove tumors within the bladder. TURBT is the most common treatment for early stage bladder cancer.  General instructions:     Your recent bladder surgery requires very little post hospital care but some definite precautions.  Despite the fact that no skin incisions were used, the area around the bladder incisions are raw and covered with scabs to promote healing and prevent bleeding. Certain precautions are needed to insure that the scabs are not disturbed over the next 2-4 weeks while the healing proceeds.  Because the raw surface inside your bladder and the irritating effects of urine you may expect frequency of urination and/or urgency (a stronger desire to urinate) and perhaps even getting up at night more often. This will usually resolve or improve slowly over the healing period. You may see some blood in your urine over the first 6 weeks. Do not be alarmed, even if the urine was clear for a while. Get off your feet and drink lots of fluids until clearing occurs. If you start to pass clots or don't improve call us.  Diet:  You may return to your normal diet immediately. Because of the raw surface of your bladder, alcohol, spicy foods, foods high in acid and drinks with caffeine may cause irritation or frequency and should be used in moderation. To keep your urine flowing freely and avoid constipation, drink plenty of fluids during the day (8-10 glasses). Tip: Avoid cranberry juice because it is very acidic.  Activity:  Your physical activity doesn't need to be restricted. However, if you are very active, you may see some blood in the urine. We suggest that you reduce your activity under the circumstances until the bleeding has stopped.  Bowels:  It is important to keep your bowels regular during the postoperative  period. Straining with bowel movements can cause bleeding. A bowel movement every other day is reasonable. Use a mild laxative if needed, such as milk of magnesia 2-3 tablespoons, or 2 Dulcolax tablets. Call if you continue to have problems. If you had been taking narcotics for pain, before, during or after your surgery, you may be constipated. Take a laxative if necessary.    Medication:  You should resume your pre-surgery medications unless told not to. In addition you may be given an antibiotic to prevent or treat infection. Antibiotics are not always necessary. All medication should be taken as prescribed until the bottles are finished unless you are having an unusual reaction to one of the drugs.     Transurethral Resection of Bladder Tumor, Care After This sheet gives you information about how to care for yourself after your procedure. Your health care provider may also give you more specific instructions. If you have problems or questions, contact your health care provider. What can I expect after the procedure? After the procedure, it is common to have:  A small amount of blood in your urine for up to 2 weeks.  Soreness or mild pain from your catheter. After your catheter is removed, you may have mild soreness, especially when urinating.  Pain in your lower abdomen. Follow these instructions at home: Medicines   Take over-the-counter and prescription medicines only as told by your health care provider.  If you were prescribed an antibiotic medicine, take it as told by your health care provider. Do not stop taking the antibiotic even  if you start to feel better.  Do not drive for 24 hours if you were given a sedative during your procedure.  Ask your health care provider if the medicine prescribed to you: ? Requires you to avoid driving or using heavy machinery. ? Can cause constipation. You may need to take these actions to prevent or treat constipation:  Take  over-the-counter or prescription medicines.  Eat foods that are high in fiber, such as beans, whole grains, and fresh fruits and vegetables.  Limit foods that are high in fat and processed sugars, such as fried or sweet foods. Activity  Return to your normal activities as told by your health care provider. Ask your health care provider what activities are safe for you.  Do not lift anything that is heavier than 10 lb (4.5 kg), or the limit that you are told, until your health care provider says that it is safe.  Avoid intense physical activity for as long as told by your health care provider.  Rest as told by your health care provider.  Avoid sitting for a long time without moving. Get up to take short walks every 1-2 hours. This is important to improve blood flow and breathing. Ask for help if you feel weak or unsteady. General instructions   Do not drink alcohol for as long as told by your health care provider. This is especially important if you are taking prescription pain medicines.  Do not take baths, swim, or use a hot tub until your health care provider approves. Ask your health care provider if you may take showers. You may only be allowed to take sponge baths.  If you have a catheter, follow instructions from your health care provider about caring for your catheter and your drainage bag.  Drink enough fluid to keep your urine pale yellow.  Wear compression stockings as told by your health care provider. These stockings help to prevent blood clots and reduce swelling in your legs.  Keep all follow-up visits as told by your health care provider. This is important. ? You will need to be followed closely with regular checks of your bladder and urethra (cystoscopies) to make sure that the cancer does not come back. Contact a health care provider if:  You have pain that gets worse or does not improve with medicine.  You have blood in your urine for more than 2 weeks.  You  have cloudy or bad-smelling urine.  You become constipated. Signs of constipation may include having: ? Fewer than three bowel movements in a week. ? Difficulty having a bowel movement. ? Stools that are dry, hard, or larger than normal.  You have a fever. Get help right away if:  You have: ? Severe pain. ? Bright red blood in your urine. ? Blood clots in your urine. ? A lot of blood in your urine.  Your catheter has been removed and you are not able to urinate.  You have a catheter in place and the catheter is not draining urine. Summary  After your procedure, it is common to have a small amount of blood in your urine, soreness or mild pain from your catheter, and pain in your lower abdomen.  Take over-the-counter and prescription medicines only as told by your health care provider.  Rest as told by your health care provider. Follow your health care provider's instructions about returning to normal activities. Ask what activities are safe for you.  If you have a catheter, follow instructions from your  health care provider about caring for your catheter and your drainage bag.  Get help right away if you cannot urinate, you have severe pain, or you have bright red blood or blood clots in your urine. This information is not intended to replace advice given to you by your health care provider. Make sure you discuss any questions you have with your health care provider. Document Revised: 07/26/2017 Document Reviewed: 07/26/2017 Elsevier Patient Education  [place] Instructions  Activity: Get plenty of rest for the remainder of the day. A responsible individual must stay with you for 24 hours following the procedure.  For the next 24 hours, DO NOT: -Drive a car -Paediatric nurse -Drink alcoholic beverages -Take any medication unless instructed by your physician -Make any legal decisions or sign important papers.  Meals: Start with  liquid foods such as gelatin or soup. Progress to regular foods as tolerated. Avoid greasy, spicy, heavy foods. If nausea and/or vomiting occur, drink only clear liquids until the nausea and/or vomiting subsides. Call your physician if vomiting continues.  Special Instructions/Symptoms: Your throat may feel dry or sore from the anesthesia or the breathing tube placed in your throat during surgery. If this causes discomfort, gargle with warm salt water. The discomfort should disappear within 24 hours.

## 2019-08-22 NOTE — Transfer of Care (Signed)
Immediate Anesthesia Transfer of Care Note  Patient: Tyler Calderon  Procedure(s) Performed: TRANSURETHRAL RESECTION OF BLADDER TUMOR WITH GENCITABINE (N/A Bladder)  Patient Location: PACU  Anesthesia Type:General  Level of Consciousness: awake, alert  and oriented  Airway & Oxygen Therapy: Patient Spontanous Breathing and Patient connected to face mask oxygen  Post-op Assessment: Report given to RN and Post -op Vital signs reviewed and stable  Post vital signs: Reviewed and stable  Last Vitals:  Vitals Value Taken Time  BP 133/81 08/22/19 1242  Temp    Pulse 83 08/22/19 1244  Resp 13 08/22/19 1244  SpO2 97 % 08/22/19 1244  Vitals shown include unvalidated device data.  Last Pain:  Vitals:   08/22/19 1008  TempSrc: Oral         Complications: No complications documented.

## 2019-08-22 NOTE — Op Note (Signed)
Operative Note  Preoperative diagnosis:  1. Bladder tumor  Postoperative diagnosis: 1. Bladder tumor--medium  Procedure(s): 1. Transurethral resection of bladder tumor--medium 2. Urethral dilation 3. Intravesical instillation of gemcitabine  Surgeon: Link Snuffer, MD  Assistants: None  Anesthesia: General  Complications: None immediate  EBL: Minimal  Specimens: 1. Bladder tumor  Drains/Catheters: 1. 18 French Foley catheter  Intraoperative findings: 1. Normal anterior urethra 2. Borderline obstructing prostate 3. Left ureteral orifice was normal. There is approximately 3 cm papillary tumor on the right posterior bladder. Very close to the right ureteral orifice. I do not believe I had to fulgurate the ureteral orifice at all. However, resection was very close. All tumor appeared to be completely resected superficially.  Indication: 69 year old male underwent hematuria work-up and was found to have a bladder tumor. He presents for the previously mentioned operation.  Description of procedure:  The patient was identified and consent was obtained.  The patient was taken to the operating room and placed in the supine position.  The patient was placed under general anesthesia.  Perioperative antibiotics were administered.  The patient was placed in dorsal lithotomy.  Patient was prepped and draped in a standard sterile fashion and a timeout was performed.  A 26 French resectoscope was attempted to advance into the urethra. There was resistance. Therefore, I used sounds to dilate sequentially from 20 [place] up to 30 [place]. The resectoscope with a visual obturator in place was then advanced easily into the urethra and into the bladder. I exchanged for the bipolar working element. I performed a complete cystoscopy with the findings noted above. On bipolar settings, I resected the bladder tumor of interest and fulgurated the bladder resection bed. Resection was very close to the right  ureteral orifice but I believe I was able to spare this. I collected all bladder tumor for specimen. I reinspected the bladder mucosa and there was no active bleeding noted. I reinspected the entire bladder and no other tumors were seen. I therefore withdrew the scope and placed an 68 French Foley catheter. This concluded the operation. The patient tolerated the procedure well and was stable postoperative.  In the PACU, I instilled intravesical chemotherapy into the bladder where it remained for proxy 1 hour prior to disposal.  Plan: Return in 1 week for pathology review

## 2019-08-22 NOTE — H&P (Signed)
CC/HPI: Referral from Dr. Matilde Sprang. Patient had an episode of gross hematuria. For this, he underwent a CT of the abdomen and pelvis with contrast on 06/19/2019. This revealed enhancing bladder mass that was about 1.5 cm on CT. This was confirmed cystoscopically by Dr. Matilde Sprang. Patient presents to discuss TURBT. He states his primary care physician is checking his PSA. Prostate exam documented as normal by his other urologist. He declines rechecking PSA today.     ALLERGIES: No Known Drug Allergies    MEDICATIONS: Aspirin 81 mg tablet,chewable  Simvastatin 20 mg tablet  Tamsulosin Hcl 0.4 mg capsule  Aller-Tec 10 mg tablet  Lorazepam 0.5 mg tablet  Montelukast Sodium 10 mg tablet  Multivitamin  Nasacort 55 mcg aerosol, spray     GU PSH: Cystoscopy - 06/26/2019     NON-GU PSH: Back surgery Shoulder Surgery (Unspecified)     GU PMH: Gross hematuria - 06/26/2019 Nocturia - 06/26/2019 Urinary Frequency - 06/26/2019    NON-GU PMH: Hypercholesterolemia    FAMILY HISTORY: Congestive Heart Failure - Father Death In The Family Mother - Mother Diabetes - Uncle   SOCIAL HISTORY: Marital Status: Married Current Smoking Status: Patient does not smoke anymore. Has not smoked since 06/09/1981. Smoked for 12 years. Smoked 1 pack per day.   Tobacco Use Assessment Completed: Used Tobacco in last 30 days? Drinks 4 drinks per day.  Drinks 2 caffeinated drinks per day.    REVIEW OF SYSTEMS:    GU Review Male:   Patient denies frequent urination, hard to postpone urination, burning/ pain with urination, get up at night to urinate, leakage of urine, stream starts and stops, trouble starting your stream, have to strain to urinate , erection problems, and penile pain.  Gastrointestinal (Upper):   Patient denies nausea, vomiting, and indigestion/ heartburn.  Gastrointestinal (Lower):   Patient denies diarrhea and constipation.  Constitutional:   Patient denies fever, night sweats, weight loss,  and fatigue.  Skin:   Patient denies skin rash/ lesion and itching.  Eyes:   Patient denies blurred vision and double vision.  Ears/ Nose/ Throat:   Patient denies sore throat and sinus problems.  Hematologic/Lymphatic:   Patient denies swollen glands and easy bruising.  Cardiovascular:   Patient denies leg swelling and chest pains.  Respiratory:   Patient denies cough and shortness of breath.  Endocrine:   Patient denies excessive thirst.  Musculoskeletal:   Patient denies back pain and joint pain.  Neurological:   Patient denies headaches and dizziness.  Psychologic:   Patient denies anxiety and depression.   VITAL SIGNS:      07/17/2019 09:49 AM  Weight 185 lb / 83.91 kg  Height 74 in / 187.96 cm  BP 135/84 mmHg  Pulse 80 /min  Temperature 97.3 F / 36.2 C  BMI 23.8 kg/m   Complexity of Data:  Source Of History:  Patient  Records Review:   Previous Doctor Records, Previous Patient Records  Urine Test Review:   Urinalysis  X-Ray Review: C.T. Abdomen/Pelvis: Reviewed Films. Reviewed Report. Discussed With Patient.     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 Risks and benefits discussed including but not limited to bleeding, infection, injury to surrounding structures including bladder perforation and ureteral injury, need for additional procedures.   CC: Dr.  Leonides Schanz  Dr. Matilde Sprang    Signed by Link Snuffer, III, M.D. on 07/17/19 at 10:33 AM (EDT

## 2019-08-22 NOTE — Anesthesia Procedure Notes (Signed)
Procedure Name: LMA Insertion Date/Time: 08/22/2019 12:04 PM Performed by: Genelle Bal, CRNA Pre-anesthesia Checklist: Patient identified, Emergency Drugs available, Suction available and Patient being monitored Patient Re-evaluated:Patient Re-evaluated prior to induction Oxygen Delivery Method: Circle system utilized Preoxygenation: Pre-oxygenation with 100% oxygen Induction Type: IV induction Ventilation: Mask ventilation without difficulty LMA: LMA inserted LMA Size: 4.0 Number of attempts: 1 Airway Equipment and Method: Bite block Placement Confirmation: positive ETCO2 Tube secured with: Tape Dental Injury: Teeth and Oropharynx as per pre-operative assessment

## 2019-08-22 NOTE — Anesthesia Preprocedure Evaluation (Signed)
Anesthesia Evaluation  Patient identified by MRN, date of birth, ID band Patient awake    Reviewed: Allergy & Precautions, NPO status , Patient's Chart, lab work & pertinent test results  Airway Mallampati: II  TM Distance: >3 FB Neck ROM: Full    Dental no notable dental hx.    Pulmonary neg pulmonary ROS, former smoker,    Pulmonary exam normal breath sounds clear to auscultation       Cardiovascular negative cardio ROS Normal cardiovascular exam Rhythm:Regular Rate:Normal     Neuro/Psych negative neurological ROS  negative psych ROS   GI/Hepatic negative GI ROS, (+)     substance abuse  alcohol use,   Endo/Other  negative endocrine ROS  Renal/GU negative Renal ROS  negative genitourinary   Musculoskeletal negative musculoskeletal ROS (+)   Abdominal   Peds negative pediatric ROS (+)  Hematology negative hematology ROS (+)   Anesthesia Other Findings   Reproductive/Obstetrics negative OB ROS                             Anesthesia Physical Anesthesia Plan  ASA: II  Anesthesia Plan: General   Post-op Pain Management:    Induction: Intravenous  PONV Risk Score and Plan: 2 and Ondansetron, Dexamethasone and Treatment may vary due to age or medical condition  Airway Management Planned: LMA  Additional Equipment:   Intra-op Plan:   Post-operative Plan: Extubation in OR  Informed Consent: I have reviewed the patients History and Physical, chart, labs and discussed the procedure including the risks, benefits and alternatives for the proposed anesthesia with the patient or authorized representative who has indicated his/her understanding and acceptance.     Dental advisory given  Plan Discussed with: CRNA and Surgeon  Anesthesia Plan Comments:         Anesthesia Quick Evaluation

## 2019-08-22 NOTE — Interval H&P Note (Signed)
History and Physical Interval Note:  08/22/2019 11:55 AM  Tyler Calderon  has presented today for surgery, with the diagnosis of BLADDER TUMOR.  The various methods of treatment have been discussed with the patient and family. After consideration of risks, benefits and other options for treatment, the patient has consented to  Procedure(s): TRANSURETHRAL RESECTION OF BLADDER TUMOR WITH GENCITABINE (N/A) as a surgical intervention.  The patient's history has been reviewed, patient examined, no change in status, stable for surgery.  I have reviewed the patient's chart and labs.  Questions were answered to the patient's satisfaction.     Marton Redwood, III

## 2019-08-25 ENCOUNTER — Encounter (HOSPITAL_BASED_OUTPATIENT_CLINIC_OR_DEPARTMENT_OTHER): Payer: Self-pay | Admitting: Urology

## 2019-08-25 LAB — SURGICAL PATHOLOGY

## 2019-08-25 NOTE — Anesthesia Postprocedure Evaluation (Signed)
Anesthesia Post Note  Patient: Tyler Calderon  Procedure(s) Performed: TRANSURETHRAL RESECTION OF BLADDER TUMOR WITH GENCITABINE (N/A Bladder)     Patient location during evaluation: PACU Anesthesia Type: General Level of consciousness: awake and alert Pain management: pain level controlled Vital Signs Assessment: post-procedure vital signs reviewed and stable Respiratory status: spontaneous breathing, nonlabored ventilation, respiratory function stable and patient connected to nasal cannula oxygen Cardiovascular status: blood pressure returned to baseline and stable Postop Assessment: no apparent nausea or vomiting Anesthetic complications: no   No complications documented.  Last Vitals:  Vitals:   08/22/19 1445 08/22/19 1515  BP: 137/84 132/79  Pulse: 63 74  Resp: 16 16  Temp:  36.5 C  SpO2: 97% 96%    Last Pain:  Vitals:   08/22/19 1700  TempSrc:   PainSc: 2                  Kesean Serviss S

## 2019-08-29 DIAGNOSIS — C672 Malignant neoplasm of lateral wall of bladder: Secondary | ICD-10-CM | POA: Diagnosis not present

## 2019-09-22 DIAGNOSIS — Z03818 Encounter for observation for suspected exposure to other biological agents ruled out: Secondary | ICD-10-CM | POA: Diagnosis not present

## 2019-09-23 ENCOUNTER — Ambulatory Visit: Payer: Medicare Other | Admitting: Allergy and Immunology

## 2019-09-23 DIAGNOSIS — Z5111 Encounter for antineoplastic chemotherapy: Secondary | ICD-10-CM | POA: Diagnosis not present

## 2019-09-23 DIAGNOSIS — C672 Malignant neoplasm of lateral wall of bladder: Secondary | ICD-10-CM | POA: Diagnosis not present

## 2019-09-28 NOTE — Patient Instructions (Addendum)
Allergic rhinitis Continue Nasacort 1 spray each nostril 3-7 times per week In the right nostril, point the applicator out toward the right ear. In the left nostril, point the applicator out toward the left ear Continue cetrizine 10 mg once a day as needed for runny nose or itching May use saline rinse as needed for nasal symptoms  Dyspnea on exertion Continue montelukast 10 mg once a day  May use Mucinex DM 2 tabs by mouth twice a day as needed Engage in progressive aerobic exercise program Consider full PFT in the future   Monoclonal B-cell lymphocytosis of undetermined significance Continue follow up with hematologist  Please let us know if this treatment plan is not working well for you Schedule follow up appointment in 1 year

## 2019-09-29 ENCOUNTER — Ambulatory Visit (INDEPENDENT_AMBULATORY_CARE_PROVIDER_SITE_OTHER): Payer: Medicare Other | Admitting: Family

## 2019-09-29 ENCOUNTER — Encounter: Payer: Self-pay | Admitting: Family

## 2019-09-29 ENCOUNTER — Other Ambulatory Visit: Payer: Self-pay

## 2019-09-29 VITALS — BP 116/66 | HR 70 | Temp 97.3°F | Resp 18 | Ht 74.0 in | Wt 189.2 lb

## 2019-09-29 DIAGNOSIS — D72820 Lymphocytosis (symptomatic): Secondary | ICD-10-CM

## 2019-09-29 DIAGNOSIS — R0609 Other forms of dyspnea: Secondary | ICD-10-CM

## 2019-09-29 DIAGNOSIS — R0600 Dyspnea, unspecified: Secondary | ICD-10-CM | POA: Diagnosis not present

## 2019-09-29 DIAGNOSIS — J3089 Other allergic rhinitis: Secondary | ICD-10-CM | POA: Diagnosis not present

## 2019-09-29 NOTE — Progress Notes (Signed)
[facility] [place] [address] Dept: [phone]  FOLLOW UP NOTE  Patient ID: PEARCE LITTLEFIELD, male    DOB: 04/09/1950  Age: 69 y.o. MRN: [id number] Date of Office Visit: 09/29/2019  Assessment  Chief Complaint: Follow-up  HPI MUADH CREASY is a 69 year old male who presents today for follow-up of allergic rhinitis and dyspnea on exertion. He was last seen on October 01, 2018 by Dr. Neldon Mc.  Allergic rhinitis is reported as controlled with Nasacort 1 spray every other day, cetirizine 10 mg once a day, and nasal saline as needed. He denies any rhinorrhea, nasal congestion, postnasal drip, and itchy watery eyes. He reports occasional sneezing and nosebleeds when he uses his Nasacort every day. He decided to use his Nasacort every other day and this has helped stop the nosebleeds. After discussing with him he was not pointing the Nasacort applicator out toward the corner of his eyes when he used it.  Dyspnea on exertion is reported as not changed since his last office visit.  His exercise tolerance test on October 09, 2017 showed," ETT with normal exercise tolerance; no chest pain; normal blood pressure response; no ST changes; negative adequate exercise tolerance test; [facility] treadmill score 8. "  His CBC with differential on September 13, 2017 showed no signs of anemia and his chest x-ray on September 13, 2017 read," no edema or consolidation. " He denies any coughing, wheezing, tightness in chest, and shortness of breath at rest. Since his last office visit he has not required any steroids or made any trips to the emergency room or urgent care due to breathing problems.He reports that his shortness of breath does not bother him unless he is climbing up stairs or walking at a faster pace than usual. It will take him 5 to 6 seconds to catch his breath and then he is back to baseline. He has not tried in engaging in progressive aerobic exercise as discussed at his last office visit with  Dr. Neldon Mc. At this time he is not interested in pursuing a full pulmonary function test.  He continues to follow up with hematology and last saw the hematologist on February 06, 2019.  Since his last office visit on August 22, 2019 he had a transurethral resection of bladder tumor. He reports that it is stage 0 cancer and is receiving BCG treatments every Tuesday for 6 weeks. This Tuesday will be his second treatment.  Current medications are as listed in the chart.   Drug Allergies:  No Known Allergies  Review of Systems: Review of Systems  Constitutional: Negative for chills, fever and weight loss.  HENT: Negative for congestion.        Denies rhinorrhea and post nasal drip  Eyes:       Denies itchy water eyes  Respiratory: Positive for shortness of breath. Negative for cough and wheezing.        Reports shortness of breath with exertion   Cardiovascular: Negative for chest pain and palpitations.  Gastrointestinal: Negative for abdominal pain and heartburn.  Genitourinary:       Denies difficulty urinating  Skin: Negative for itching and rash.  Neurological: Negative for headaches.     Physical Exam: BP 116/66 (BP Location: Left Arm, Patient Position: Sitting, Cuff Size: Normal)   Pulse 70   Temp (!) 97.3 F (36.3 C) (Temporal)   Resp 18   Ht 6\' 2"  (1.88 m)   Wt 189 lb 3.2 oz (85.8 kg)   SpO2  98%   BMI 24.29 kg/m    Physical Exam Constitutional:      Appearance: Normal appearance.  HENT:     Head: Normocephalic and atraumatic.     Comments: Pharynx normal. Eyes normal. Ears normal. Nose normal    Right Ear: Tympanic membrane, ear canal and external ear normal.     Left Ear: Tympanic membrane, ear canal and external ear normal.     Nose: Nose normal.     Mouth/Throat:     Mouth: Mucous membranes are moist.     Pharynx: Oropharynx is clear.  Eyes:     Conjunctiva/sclera: Conjunctivae normal.  Cardiovascular:     Rate and Rhythm: Normal rate and regular rhythm.      Heart sounds: Normal heart sounds.  Pulmonary:     Effort: Pulmonary effort is normal.     Breath sounds: Normal breath sounds.     Comments: Lungs clear to auscultation Musculoskeletal:     Cervical back: Neck supple.  Skin:    General: Skin is warm.  Neurological:     Mental Status: He is alert and oriented to person, place, and time.  Psychiatric:        Mood and Affect: Mood normal.        Behavior: Behavior normal.        Thought Content: Thought content normal.        Judgment: Judgment normal.     Diagnostics: FVC 4.92 L, FEV1 3.46 L. Predicted FVC 5 5.20 L, FEV1 3.86 L. Spirometry indicates normal ventilatory function.  Assessment and Plan: 1. Dyspnea on exertion   2. Other allergic rhinitis   3. Monoclonal B-cell lymphocytosis of undetermined significance     No orders of the defined types were placed in this encounter.   Patient Instructions  Allergic rhinitis Continue Nasacort 1 spray each nostril 3-7 times per week In the right nostril, point the applicator out toward the right ear. In the left nostril, point the applicator out toward the left ear Continue cetrizine 10 mg once a day as needed for runny nose or itching May use saline rinse as needed for nasal symptoms  Dyspnea on exertion Continue montelukast 10 mg once a day  May use Mucinex DM 2 tabs by mouth twice a day as needed Engage in progressive aerobic exercise program Consider full PFT in the future   Monoclonal B-cell lymphocytosis of undetermined significance Continue follow up with hematologist  Please let us know if this treatment plan is not working well for you Schedule follow up appointment in 1 year   Return in about 1 year (around 09/28/2020), or if symptoms worsen or fail to improve.    Thank you for the opportunity to care for this patient.  Please do not hesitate to contact me with questions.  Althea Charon, FNP [facility]

## 2019-09-30 DIAGNOSIS — C672 Malignant neoplasm of lateral wall of bladder: Secondary | ICD-10-CM | POA: Diagnosis not present

## 2019-09-30 DIAGNOSIS — Z5111 Encounter for antineoplastic chemotherapy: Secondary | ICD-10-CM | POA: Diagnosis not present

## 2019-10-04 DIAGNOSIS — Z23 Encounter for immunization: Secondary | ICD-10-CM | POA: Diagnosis not present

## 2019-10-07 DIAGNOSIS — Z5111 Encounter for antineoplastic chemotherapy: Secondary | ICD-10-CM | POA: Diagnosis not present

## 2019-10-07 DIAGNOSIS — C672 Malignant neoplasm of lateral wall of bladder: Secondary | ICD-10-CM | POA: Diagnosis not present

## 2019-10-10 DIAGNOSIS — Z03818 Encounter for observation for suspected exposure to other biological agents ruled out: Secondary | ICD-10-CM | POA: Diagnosis not present

## 2019-10-13 DIAGNOSIS — Z03818 Encounter for observation for suspected exposure to other biological agents ruled out: Secondary | ICD-10-CM | POA: Diagnosis not present

## 2019-10-14 DIAGNOSIS — Z5111 Encounter for antineoplastic chemotherapy: Secondary | ICD-10-CM | POA: Diagnosis not present

## 2019-10-14 DIAGNOSIS — C672 Malignant neoplasm of lateral wall of bladder: Secondary | ICD-10-CM | POA: Diagnosis not present

## 2019-10-21 DIAGNOSIS — Z5111 Encounter for antineoplastic chemotherapy: Secondary | ICD-10-CM | POA: Diagnosis not present

## 2019-10-21 DIAGNOSIS — C672 Malignant neoplasm of lateral wall of bladder: Secondary | ICD-10-CM | POA: Diagnosis not present

## 2019-10-22 DIAGNOSIS — Z23 Encounter for immunization: Secondary | ICD-10-CM | POA: Diagnosis not present

## 2019-10-28 DIAGNOSIS — C672 Malignant neoplasm of lateral wall of bladder: Secondary | ICD-10-CM | POA: Diagnosis not present

## 2019-10-28 DIAGNOSIS — Z5111 Encounter for antineoplastic chemotherapy: Secondary | ICD-10-CM | POA: Diagnosis not present

## 2019-10-29 DIAGNOSIS — N401 Enlarged prostate with lower urinary tract symptoms: Secondary | ICD-10-CM | POA: Diagnosis not present

## 2019-10-29 DIAGNOSIS — E7800 Pure hypercholesterolemia, unspecified: Secondary | ICD-10-CM | POA: Diagnosis not present

## 2019-10-29 DIAGNOSIS — N400 Enlarged prostate without lower urinary tract symptoms: Secondary | ICD-10-CM | POA: Diagnosis not present

## 2019-11-09 DIAGNOSIS — Z20822 Contact with and (suspected) exposure to covid-19: Secondary | ICD-10-CM | POA: Diagnosis not present

## 2019-11-25 DIAGNOSIS — J309 Allergic rhinitis, unspecified: Secondary | ICD-10-CM | POA: Diagnosis not present

## 2019-11-25 DIAGNOSIS — N529 Male erectile dysfunction, unspecified: Secondary | ICD-10-CM | POA: Diagnosis not present

## 2019-11-25 DIAGNOSIS — Z86010 Personal history of colonic polyps: Secondary | ICD-10-CM | POA: Diagnosis not present

## 2019-11-25 DIAGNOSIS — R319 Hematuria, unspecified: Secondary | ICD-10-CM | POA: Diagnosis not present

## 2019-11-25 DIAGNOSIS — N401 Enlarged prostate with lower urinary tract symptoms: Secondary | ICD-10-CM | POA: Diagnosis not present

## 2019-11-25 DIAGNOSIS — Z125 Encounter for screening for malignant neoplasm of prostate: Secondary | ICD-10-CM | POA: Diagnosis not present

## 2019-11-25 DIAGNOSIS — Z0000 Encounter for general adult medical examination without abnormal findings: Secondary | ICD-10-CM | POA: Diagnosis not present

## 2019-11-25 DIAGNOSIS — G479 Sleep disorder, unspecified: Secondary | ICD-10-CM | POA: Diagnosis not present

## 2019-11-25 DIAGNOSIS — E7800 Pure hypercholesterolemia, unspecified: Secondary | ICD-10-CM | POA: Diagnosis not present

## 2019-11-26 ENCOUNTER — Other Ambulatory Visit: Payer: Self-pay | Admitting: Family Medicine

## 2019-11-26 DIAGNOSIS — Z136 Encounter for screening for cardiovascular disorders: Secondary | ICD-10-CM

## 2019-11-27 DIAGNOSIS — C672 Malignant neoplasm of lateral wall of bladder: Secondary | ICD-10-CM | POA: Diagnosis not present

## 2019-11-27 DIAGNOSIS — R8279 Other abnormal findings on microbiological examination of urine: Secondary | ICD-10-CM | POA: Diagnosis not present

## 2019-11-30 IMAGING — CR DG CHEST 2V
2 series · 2 of 2 positions shown · non-contrast
Comparison: January 23, 2011

CLINICAL DATA: Shortness of breath with exertion

EXAM:
CHEST - 2 VIEW

[w chest pa]
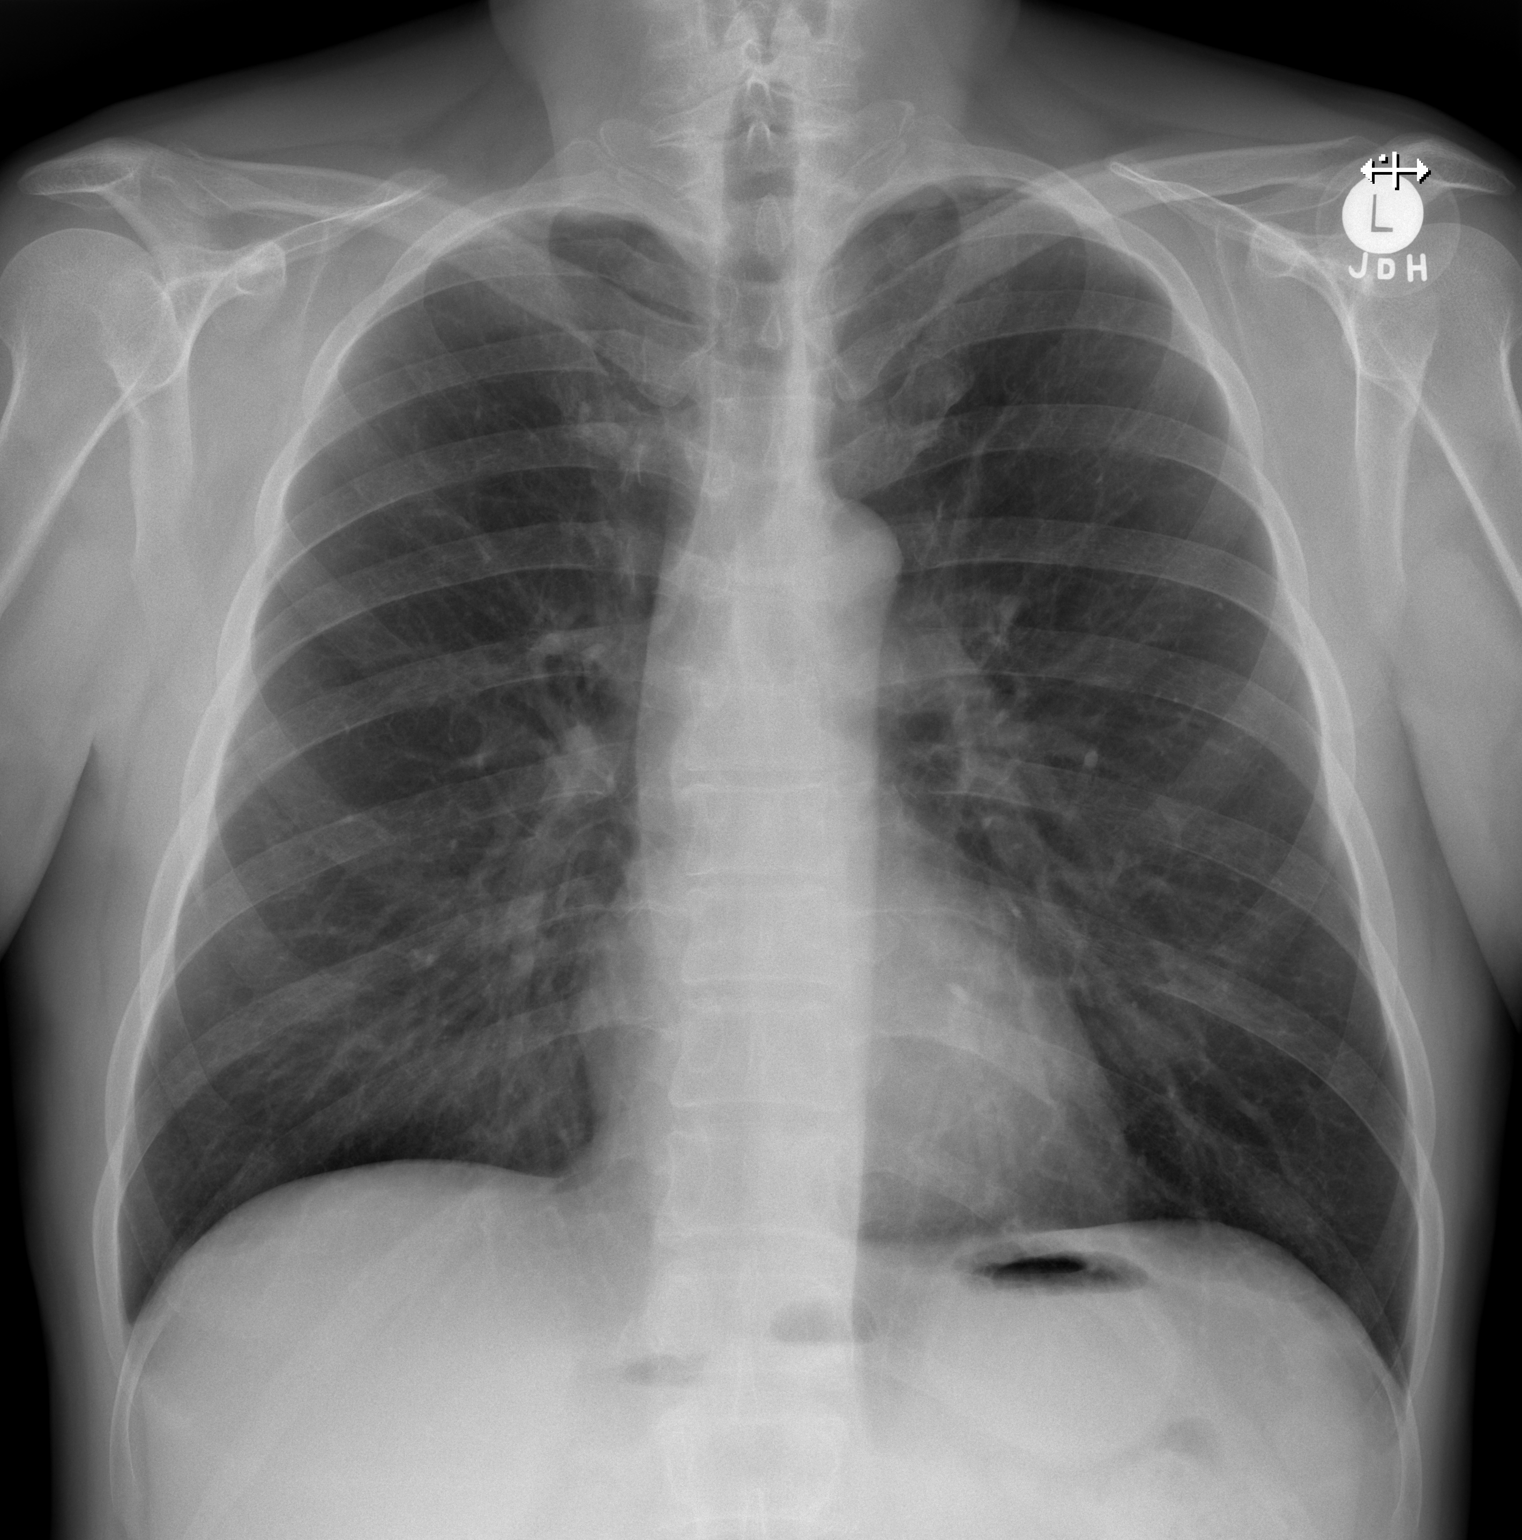

[w chest lat]
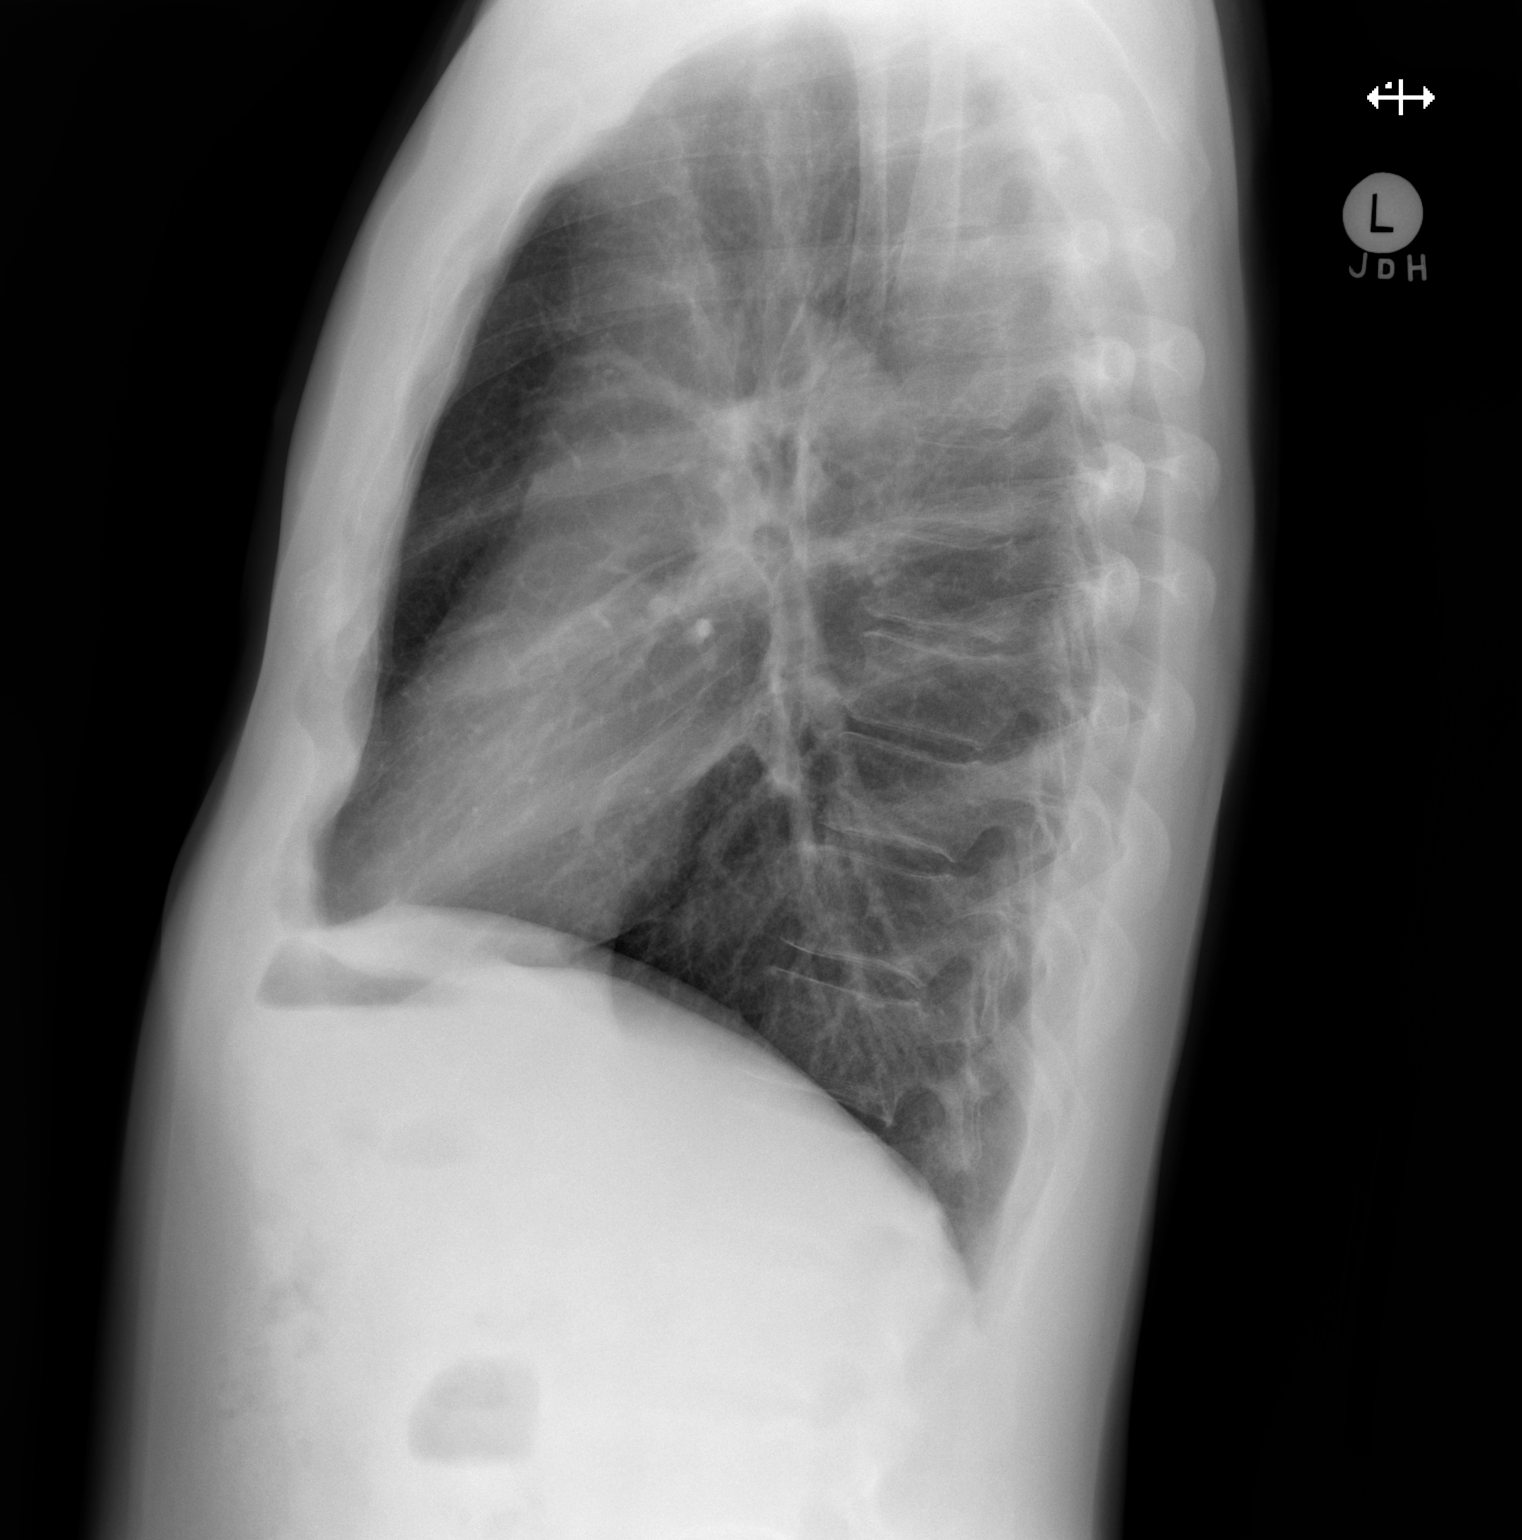

[2 of 2 positions shown; findings below may reference images not displayed]

FINDINGS: No edema or consolidation. Heart size and pulmonary vascularity are
normal. No adenopathy. No bone lesions.
IMPRESSION: No edema or consolidation.

## 2019-12-08 ENCOUNTER — Ambulatory Visit
Admission: RE | Admit: 2019-12-08 | Discharge: 2019-12-08 | Disposition: A | Discharge status: Home / Self Care | Payer: Medicare Other | Source: Ambulatory Visit | Attending: Family Medicine | Admitting: Family Medicine

## 2019-12-08 DIAGNOSIS — Z136 Encounter for screening for cardiovascular disorders: Secondary | ICD-10-CM | POA: Diagnosis not present

## 2019-12-08 DIAGNOSIS — Z87891 Personal history of nicotine dependence: Secondary | ICD-10-CM | POA: Diagnosis not present

## 2019-12-09 DIAGNOSIS — Z03818 Encounter for observation for suspected exposure to other biological agents ruled out: Secondary | ICD-10-CM | POA: Diagnosis not present

## 2019-12-15 DIAGNOSIS — N401 Enlarged prostate with lower urinary tract symptoms: Secondary | ICD-10-CM | POA: Diagnosis not present

## 2019-12-15 DIAGNOSIS — N400 Enlarged prostate without lower urinary tract symptoms: Secondary | ICD-10-CM | POA: Diagnosis not present

## 2019-12-15 DIAGNOSIS — E7800 Pure hypercholesterolemia, unspecified: Secondary | ICD-10-CM | POA: Diagnosis not present

## 2019-12-18 ENCOUNTER — Other Ambulatory Visit: Payer: Self-pay | Admitting: Allergy and Immunology

## 2019-12-22 DIAGNOSIS — K1370 Unspecified lesions of oral mucosa: Secondary | ICD-10-CM | POA: Diagnosis not present

## 2020-01-01 DIAGNOSIS — Z20828 Contact with and (suspected) exposure to other viral communicable diseases: Secondary | ICD-10-CM | POA: Diagnosis not present

## 2020-01-04 DIAGNOSIS — R059 Cough, unspecified: Secondary | ICD-10-CM | POA: Diagnosis not present

## 2020-01-04 DIAGNOSIS — J22 Unspecified acute lower respiratory infection: Secondary | ICD-10-CM | POA: Diagnosis not present

## 2020-01-07 DIAGNOSIS — Z20822 Contact with and (suspected) exposure to covid-19: Secondary | ICD-10-CM | POA: Diagnosis not present

## 2020-01-07 DIAGNOSIS — R059 Cough, unspecified: Secondary | ICD-10-CM | POA: Diagnosis not present

## 2020-01-07 DIAGNOSIS — Z03818 Encounter for observation for suspected exposure to other biological agents ruled out: Secondary | ICD-10-CM | POA: Diagnosis not present

## 2020-01-07 DIAGNOSIS — J069 Acute upper respiratory infection, unspecified: Secondary | ICD-10-CM | POA: Diagnosis not present

## 2020-01-15 DIAGNOSIS — H35413 Lattice degeneration of retina, bilateral: Secondary | ICD-10-CM | POA: Diagnosis not present

## 2020-01-15 DIAGNOSIS — H5202 Hypermetropia, left eye: Secondary | ICD-10-CM | POA: Diagnosis not present

## 2020-01-15 DIAGNOSIS — H5211 Myopia, right eye: Secondary | ICD-10-CM | POA: Diagnosis not present

## 2020-01-15 DIAGNOSIS — H25813 Combined forms of age-related cataract, bilateral: Secondary | ICD-10-CM | POA: Diagnosis not present

## 2020-01-15 DIAGNOSIS — H43813 Vitreous degeneration, bilateral: Secondary | ICD-10-CM | POA: Diagnosis not present

## 2020-01-15 DIAGNOSIS — H43393 Other vitreous opacities, bilateral: Secondary | ICD-10-CM | POA: Diagnosis not present

## 2020-01-15 DIAGNOSIS — H52223 Regular astigmatism, bilateral: Secondary | ICD-10-CM | POA: Diagnosis not present

## 2020-01-15 DIAGNOSIS — H524 Presbyopia: Secondary | ICD-10-CM | POA: Diagnosis not present

## 2020-01-19 DIAGNOSIS — Z20828 Contact with and (suspected) exposure to other viral communicable diseases: Secondary | ICD-10-CM | POA: Diagnosis not present

## 2020-02-05 DIAGNOSIS — C672 Malignant neoplasm of lateral wall of bladder: Secondary | ICD-10-CM | POA: Diagnosis not present

## 2020-02-06 ENCOUNTER — Other Ambulatory Visit: Payer: Self-pay

## 2020-02-06 ENCOUNTER — Other Ambulatory Visit: Payer: Self-pay | Admitting: Hematology and Oncology

## 2020-02-06 DIAGNOSIS — D72820 Lymphocytosis (symptomatic): Secondary | ICD-10-CM

## 2020-02-09 ENCOUNTER — Other Ambulatory Visit: Payer: Self-pay

## 2020-02-09 ENCOUNTER — Encounter: Payer: Self-pay | Admitting: Hematology and Oncology

## 2020-02-09 ENCOUNTER — Inpatient Hospital Stay (HOSPITAL_BASED_OUTPATIENT_CLINIC_OR_DEPARTMENT_OTHER): Payer: Medicare Other | Admitting: Hematology and Oncology

## 2020-02-09 ENCOUNTER — Inpatient Hospital Stay: Payer: Medicare Other | Attending: Hematology and Oncology

## 2020-02-09 DIAGNOSIS — R0602 Shortness of breath: Secondary | ICD-10-CM | POA: Insufficient documentation

## 2020-02-09 DIAGNOSIS — D72820 Lymphocytosis (symptomatic): Secondary | ICD-10-CM | POA: Insufficient documentation

## 2020-02-09 DIAGNOSIS — Z8551 Personal history of malignant neoplasm of bladder: Secondary | ICD-10-CM | POA: Insufficient documentation

## 2020-02-09 DIAGNOSIS — D696 Thrombocytopenia, unspecified: Secondary | ICD-10-CM | POA: Diagnosis not present

## 2020-02-09 DIAGNOSIS — Z79899 Other long term (current) drug therapy: Secondary | ICD-10-CM | POA: Diagnosis not present

## 2020-02-09 DIAGNOSIS — K760 Fatty (change of) liver, not elsewhere classified: Secondary | ICD-10-CM | POA: Diagnosis not present

## 2020-02-09 LAB — CBC WITH DIFFERENTIAL (CANCER CENTER ONLY)
Abs Immature Granulocytes: 0.02 10*3/uL (ref 0.00–0.07)
Basophils Absolute: 0.1 10*3/uL (ref 0.0–0.1)
Basophils Relative: 1 %
Eosinophils Absolute: 0.4 10*3/uL (ref 0.0–0.5)
Eosinophils Relative: 4 %
HCT: 44.4 % (ref 39.0–52.0)
Hemoglobin: 15.1 g/dL (ref 13.0–17.0)
Immature Granulocytes: 0 %
Lymphocytes Relative: 46 %
Lymphs Abs: 4 10*3/uL (ref 0.7–4.0)
MCH: 31.1 pg (ref 26.0–34.0)
MCHC: 34 g/dL (ref 30.0–36.0)
MCV: 91.4 fL (ref 80.0–100.0)
Monocytes Absolute: 0.9 10*3/uL (ref 0.1–1.0)
Monocytes Relative: 10 %
Neutro Abs: 3.4 10*3/uL (ref 1.7–7.7)
Neutrophils Relative %: 39 %
Platelet Count: 134 10*3/uL — ABNORMAL LOW (ref 150–400)
RBC: 4.86 MIL/uL (ref 4.22–5.81)
RDW: 13.9 % (ref 11.5–15.5)
WBC Count: 8.8 10*3/uL (ref 4.0–10.5)
nRBC: 0 % (ref 0.0–0.2)

## 2020-02-09 NOTE — Assessment & Plan Note (Signed)
His last CT imaging show evidence of hepatic steatosis His thrombocytopenia is likely related to that or alcohol intake It is stable and unchanged Observe closely for now

## 2020-02-09 NOTE — Progress Notes (Signed)
[facility] OFFICE PROGRESS NOTE  Patient Care Team: Cari Caraway, MD as PCP - General (Family Medicine)  ASSESSMENT & PLAN:  Monoclonal B-cell lymphocytosis of undetermined significance We discussed the natural history of monoclonal B cell lymphocytosis of unknown significance He is not symptomatic He is educated to watch out for signs and symptoms of disease such as unexplained lymphadenopathy and unresolved infection. His lymphocyte count is stable and I think once a year visit is adequate  History of bladder cancer I have reviewed his surgical report and pathology report I would defer to urologist for management I do not believe his bladder cancer diagnosis is related to MBUS  Thrombocytopenia ([place]) His last CT imaging show evidence of hepatic steatosis His thrombocytopenia is likely related to that or alcohol intake It is stable and unchanged Observe closely for now   Orders Placed This Encounter  Procedures  . CBC with Differential/Platelet    Standing Status:   Standing    Number of Occurrences:   22    Standing Expiration Date:   02/08/2021    All questions were answered. The patient knows to call the clinic with any problems, questions or concerns. The total time spent in the appointment was 20 minutes encounter with patients including review of chart and various tests results, discussions about plan of care and coordination of care plan   Heath Lark, MD 02/09/2020 2:26 PM  INTERVAL HISTORY: Please see below for problem oriented charting. He returns with his wife for further follow-up He feels well Denies recent infection, fever or chills Appetite is fair He was diagnosed with bladder cancer last year and I reviewed surgical report and pathology report with the patient We also reviewed his CT imaging pertaining to his low platelet count and fatty liver disease  SUMMARY OF ONCOLOGIC HISTORY:  Tyler Calderon is here because of elevated  lymphocyte count. He is accompanied by his wife, Tomi Bamberger The patient is a retired Dance movement psychotherapist.  He does not smoke. He has chronic history of sinusitis/allergies and is followed by an allergist Most recently, he complained of shortness of breath on exertion.  He underwent cardiac evaluation including a negative treadmill test  On his blood work, he was noted to have mild lymphocytosis.  Flow cytometry detected monoclonal B-cell population less than 5000, nonspecific phenotype.  He was found to have abnormal CBC from his blood works in September 2019.  The total WBC is within normal limits.  The total absolute lymphocyte count is less than 5000 He denies recent infection. The last prescription antibiotics was more than 3 months ago There is not reported symptoms of urinary frequency/urgency or dysuria, diarrhea, joint swelling/pain or abnormal skin rash.  He had no prior history or diagnosis of cancer. His age appropriate screening programs are up-to-date. The patient has no prior diagnosis of autoimmune disease and was not prescribed corticosteroids related products. Last year, he was diagnosed with bladder cancer status post TURBT and instillation of chemotherapy Staging CT imaging from 2021 did not show any evidence of lymphadenopathy  REVIEW OF SYSTEMS:   Constitutional: Denies fevers, chills or abnormal weight loss Eyes: Denies blurriness of vision Ears, nose, mouth, throat, and face: Denies mucositis or sore throat Respiratory: Denies cough, dyspnea or wheezes Cardiovascular: Denies palpitation, chest discomfort or lower extremity swelling Gastrointestinal:  Denies nausea, heartburn or change in bowel habits Skin: Denies abnormal skin rashes Lymphatics: Denies new lymphadenopathy or easy bruising Neurological:Denies numbness, tingling or new weaknesses Behavioral/Psych: Mood is  stable, no new changes  All other systems were reviewed with the patient and are negative.  I have  reviewed the past medical history, past surgical history, social history and family history with the patient and they are unchanged from previous note.  ALLERGIES:  has No Known Allergies.  MEDICATIONS:  Current Outpatient Medications  Medication Sig Dispense Refill  . ASPIRIN 81 PO Take by mouth daily. (Patient not taking: Reported on 09/29/2019)    . cetirizine (ZYRTEC) 10 MG tablet Take 10 mg by mouth at bedtime.    Marland Kitchen HYDROcodone-acetaminophen (NORCO) 5-325 MG tablet Take 1 tablet by mouth every 4 (four) hours as needed for moderate pain. 8 tablet 0  . LORazepam (ATIVAN) 0.5 MG tablet 1-2 TABS BEFORE BEDTIME AS NEEDED ORALLY 30 DAYS  0  . montelukast (SINGULAIR) 10 MG tablet TAKE 1 TABLET BY MOUTH EVERYDAY AT BEDTIME 90 tablet 2  . Multiple Vitamins-Minerals (MULTIVITAMIN GUMMIES ADULT PO) Take by mouth daily.    Marland Kitchen NASACORT ALLERGY 24HR 55 MCG/ACT AERO nasal inhaler Use one spray in each nostril once daily as directed.    . simvastatin (ZOCOR) 20 MG tablet Take 20 mg by mouth at bedtime.     . tamsulosin (FLOMAX) 0.4 MG CAPS capsule at bedtime.      No current facility-administered medications for this visit.    PHYSICAL EXAMINATION: ECOG PERFORMANCE STATUS: 0 - Asymptomatic  Vitals:   02/09/20 1135  BP: 139/86  Pulse: 69  Resp: 18  Temp: (!) 97.4 F (36.3 C)  SpO2: 100%   Filed Weights   02/09/20 1135  Weight: 188 lb 12.8 oz (85.6 kg)    GENERAL:alert, no distress and comfortable NEURO: alert & oriented x 3 with fluent speech, no focal motor/sensory deficits  LABORATORY DATA:  I have reviewed the data as listed No results found for: NA, K, CL, CO2, GLUCOSE, BUN, CREATININE, CALCIUM, PROT, ALBUMIN, AST, ALT, ALKPHOS, BILITOT, GFRNONAA, GFRAA  No results found for: SPEP, UPEP  Lab Results  Component Value Date   WBC 8.8 02/09/2020   NEUTROABS 3.4 02/09/2020   HGB 15.1 02/09/2020   HCT 44.4 02/09/2020   MCV 91.4 02/09/2020   PLT 134 (L) 02/09/2020       Chemistry   No results found for: NA, K, CL, CO2, BUN, CREATININE, GLU No results found for: CALCIUM, ALKPHOS, AST, ALT, BILITOT     RADIOGRAPHIC STUDIES: I have reviewed his last CT imaging from 2021 I have personally reviewed the radiological images as listed and agreed with the findings in the report.

## 2020-02-09 NOTE — Assessment & Plan Note (Signed)
I have reviewed his surgical report and pathology report I would defer to urologist for management I do not believe his bladder cancer diagnosis is related to MBUS

## 2020-02-09 NOTE — Assessment & Plan Note (Signed)
We discussed the natural history of monoclonal B cell lymphocytosis of unknown significance He is not symptomatic He is educated to watch out for signs and symptoms of disease such as unexplained lymphadenopathy and unresolved infection. His lymphocyte count is stable and I think once a year visit is adequate

## 2020-03-22 DIAGNOSIS — Z20822 Contact with and (suspected) exposure to covid-19: Secondary | ICD-10-CM | POA: Diagnosis not present

## 2020-04-19 DIAGNOSIS — Z03818 Encounter for observation for suspected exposure to other biological agents ruled out: Secondary | ICD-10-CM | POA: Diagnosis not present

## 2020-04-30 DIAGNOSIS — C672 Malignant neoplasm of lateral wall of bladder: Secondary | ICD-10-CM | POA: Diagnosis not present

## 2020-05-06 ENCOUNTER — Other Ambulatory Visit: Payer: Self-pay | Admitting: Urology

## 2020-05-07 DIAGNOSIS — G4700 Insomnia, unspecified: Secondary | ICD-10-CM | POA: Diagnosis not present

## 2020-05-07 DIAGNOSIS — E7800 Pure hypercholesterolemia, unspecified: Secondary | ICD-10-CM | POA: Diagnosis not present

## 2020-05-07 DIAGNOSIS — N401 Enlarged prostate with lower urinary tract symptoms: Secondary | ICD-10-CM | POA: Diagnosis not present

## 2020-05-09 DIAGNOSIS — C679 Malignant neoplasm of bladder, unspecified: Secondary | ICD-10-CM

## 2020-05-09 HISTORY — DX: Malignant neoplasm of bladder, unspecified: C67.9

## 2020-05-12 ENCOUNTER — Encounter (HOSPITAL_BASED_OUTPATIENT_CLINIC_OR_DEPARTMENT_OTHER): Payer: Self-pay | Admitting: Urology

## 2020-05-13 ENCOUNTER — Encounter (HOSPITAL_BASED_OUTPATIENT_CLINIC_OR_DEPARTMENT_OTHER): Payer: Self-pay | Admitting: Urology

## 2020-05-13 ENCOUNTER — Other Ambulatory Visit: Payer: Self-pay

## 2020-05-13 NOTE — Progress Notes (Signed)
Spoke w/ via phone for pre-op interview---pt Lab needs dos----none               Lab results------none COVID test ------05-14-2020 1300 Arrive at -------1230 05-18-2020  NPO after MN NO Solid Food.  Clear liquids from MN until---1130 am then npo Med rec completed Medications to take morning of surgery -----nasacort, sertraline Diabetic medication -----n/a Patient instructed to bring photo id and insurance card day of surgery Patient aware to have Driver (ride ) / caregiver  Wife Tyler Calderon will stay   for 24 hours after surgery  Patient Special Instructions -----none Pre-Op special Istructions -----none Patient verbalized understanding of instructions that were given at this phone interview. Patient denies shortness of breath, chest pain, fever, cough at this phone interview.  Oncology lov dr Addison Bailey 02-09-2020 epic

## 2020-05-14 ENCOUNTER — Other Ambulatory Visit (HOSPITAL_COMMUNITY)
Admission: RE | Admit: 2020-05-14 | Discharge: 2020-05-14 | Disposition: A | Discharge status: Home / Self Care | Payer: Medicare Other | Source: Ambulatory Visit | Attending: Urology | Admitting: Urology

## 2020-05-14 DIAGNOSIS — Z20822 Contact with and (suspected) exposure to covid-19: Secondary | ICD-10-CM | POA: Insufficient documentation

## 2020-05-14 DIAGNOSIS — Z01812 Encounter for preprocedural laboratory examination: Secondary | ICD-10-CM | POA: Insufficient documentation

## 2020-05-15 LAB — SARS CORONAVIRUS 2 (TAT 6-24 HRS): SARS Coronavirus 2: NEGATIVE

## 2020-05-17 NOTE — Anesthesia Preprocedure Evaluation (Addendum)
Anesthesia Evaluation  Patient identified by MRN, date of birth, ID band Patient awake    Reviewed: Allergy & Precautions, NPO status , Patient's Chart, lab work & pertinent test results  History of Anesthesia Complications Negative for: history of anesthetic complications  Airway Mallampati: II  TM Distance: >3 FB Neck ROM: Full    Dental  (+) Missing,    Pulmonary former smoker,    Pulmonary exam normal        Cardiovascular negative cardio ROS Normal cardiovascular exam     Neuro/Psych Depression negative neurological ROS     GI/Hepatic negative GI ROS, (+)     substance abuse ("5 drinks per day")  alcohol use and marijuana use,   Endo/Other  negative endocrine ROS  Renal/GU negative Renal ROS   Bladder ca    Musculoskeletal negative musculoskeletal ROS (+)   Abdominal   Peds  Hematology negative hematology ROS (+)   Anesthesia Other Findings Day of surgery medications reviewed with patient.  Reproductive/Obstetrics negative OB ROS                            Anesthesia Physical Anesthesia Plan  ASA: III  Anesthesia Plan: General   Post-op Pain Management:    Induction: Intravenous  PONV Risk Score and Plan: 2 and Treatment may vary due to age or medical condition, Ondansetron, Dexamethasone and Midazolam  Airway Management Planned: LMA  Additional Equipment: None  Intra-op Plan:   Post-operative Plan: Extubation in OR  Informed Consent: I have reviewed the patients History and Physical, chart, labs and discussed the procedure including the risks, benefits and alternatives for the proposed anesthesia with the patient or authorized representative who has indicated his/her understanding and acceptance.     Dental advisory given  Plan Discussed with: CRNA  Anesthesia Plan Comments:        Anesthesia Quick Evaluation

## 2020-05-17 NOTE — H&P (Signed)
Referral from Dr. Matilde Sprang. Patient had an episode of gross hematuria. For this, he underwent a CT of the abdomen and pelvis with contrast on 06/19/2019. This revealed enhancing bladder mass that was about 1.5 cm on CT. This was confirmed cystoscopically by Dr. Matilde Sprang. Patient presents to discuss TURBT. He states his primary care physician is checking his PSA. Prostate exam documented as normal by his other urologist. He declines rechecking PSA today.   08/29/2019  Patient is status post TURBT. He has some abdominal pain yesterday and some right-sided pain but this has resolved today. If it recurs, we plan for a renal ultrasound. Resection was close to the ureteral orifice but unlikely that it was fulgurated. He denies any voiding complaints currently. 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 All tumor was resected.  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 Gloriann Loan. Underwent intravesical BCG induction therapy which completed on 10/28/2019. Here now for follow-up surveillance cystoscopy.  Cysto performed today and shows: Several areas of patchy erythema throughout the urinary bladder consistent with either cystitis versus possible BCG reaction. No isolated tumor noted.  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 Gloriann Loan. Subsequently underwent intravesical BCG induction therapy completed 10/28/2019. Follow-up cysto on 11/27/2019 showed some patchy erythematous areas which was felt to be possible BCG affect and is now scheduled for follow-up surveillance cysto. No interim GU issues. Should be noted that urine culture had last office visit grew out Staph UTI and was treated with Keflex.  Micro urinalysis today is clear on urine spun sediment.  Cystoscopy is performed today and  shows: No evidence of recurrent mucosal lesions pristine bladder mucosa.  -04/30/20-patient with history of high-grade superficial transitional cell carcinoma bladder originally resected in August of 2021 followed by intravesical BCG completed in October 2021. Recent follow-up surveillance cysto was negative in January of 2022. Here for follow-up surveillance cysto. No interim GU issues.  Micro urinalysis  Cysto performed today and shows : What appears to be 2 small recurrent papillary tumors superior and medial to the left ureteral orifice. No other recurrent lesions noted. Lesions were about 1 cm in size.     ALLERGIES: No Known Drug Allergies    MEDICATIONS: Simvastatin 20 mg tablet  Tamsulosin Hcl 0.4 mg capsule  Aller-Tec 10 mg tablet  Lorazepam 0.5 mg tablet  Montelukast Sodium 10 mg tablet  Multivitamin  Nasacort 55 mcg aerosol, spray  Sertraline Hcl 50 mg tablet     GU PSH: Bladder Instill AntiCA Agent - 10/28/2019, 10/21/2019, 10/14/2019, 10/07/2019, 09/30/2019, 09/23/2019, 08/22/2019 Cysto Dilate Stricture (M or F) - 08/22/2019 Cystoscopy - 02/05/2020, 11/27/2019, 06/26/2019 Cystoscopy TURBT 2-5 cm - 08/22/2019     NON-GU PSH: Back surgery Shoulder Surgery (Unspecified)         GU PMH: Bladder Cancer Lateral - 02/05/2020, - 11/27/2019, - 10/28/2019, - 10/21/2019, - 10/14/2019, - 10/07/2019, - 09/30/2019, - 09/23/2019, - 08/29/2019 Bladder tumor/neoplasm - 07/17/2019 Gross hematuria - 06/26/2019 Nocturia - 06/26/2019 Urinary Frequency - 06/26/2019    NON-GU PMH: Pyuria/other UA findings - 11/27/2019 Hypercholesterolemia    FAMILY HISTORY: Congestive Heart Failure - Father Death In The Family Mother - Mother Diabetes - Uncle   SOCIAL HISTORY: Marital Status: Married Current Smoking Status: Patient does not smoke anymore. Has not smoked since 06/09/1981. Smoked for 12 years. Smoked 1 pack per day.  <DIV'  Tobacco Use Assessment  Completed:  Used Tobacco in last 30 days?   Drinks 4  drinks per day.  Drinks 2 caffeinated drinks per day.    REVIEW OF SYSTEMS:     GU Review Male:  Patient denies frequent urination, hard to postpone urination, burning/ pain with urination, get up at night to urinate, leakage of urine, stream starts and stops, trouble starting your stream, have to strain to urinate , erection problems, and penile pain.    Gastrointestinal (Upper):  Patient denies nausea, vomiting, and indigestion/ heartburn.    Gastrointestinal (Lower):  Patient denies diarrhea and constipation.    Constitutional:  Patient denies fatigue, night sweats, fever, and weight loss.    Skin:  Patient denies skin rash/ lesion and itching.    Eyes:  Patient denies blurred vision and double vision.    Ears/ Nose/ Throat:  Patient denies sore throat and sinus problems.    Hematologic/Lymphatic:  Patient denies swollen glands and easy bruising.    Cardiovascular:  Patient denies leg swelling and chest pains.    Respiratory:  Patient denies cough and shortness of breath.    Endocrine:  Patient denies excessive thirst.    Musculoskeletal:  Patient denies back pain and joint pain.    Neurological:  Patient denies headaches and dizziness.    Psychologic:  Patient denies depression and anxiety.    VITAL SIGNS:       04/30/2020 10:29 AM     Weight 180 lb / 81.65 kg     Height 73 in / 185.42 cm     BP 106/69 mmHg     Pulse 78 /min     Temperature 97.1 F / 36.1 C     BMI 23.7 kg/m     GU PHYSICAL EXAMINATION:      Anus and Perineum: No hemorrhoids. No anal stenosis. No rectal fissure, no anal fissure. No edema, no dimple, no perineal tenderness, no anal tenderness.     Scrotum: No lesions. No edema. No cysts. No warts.     Epididymides: Right: no spermatocele, no masses, no cysts, no tenderness, no induration, no enlargement. Left: no spermatocele, no masses, no cysts, no tenderness, no induration, no enlargement.     Testes: No tenderness, no swelling, no enlargement left testes. No  tenderness, no swelling, no enlargement right testes. Normal location left testes. Normal location right testes. No mass, no cyst, no varicocele, no hydrocele left testes. No mass, no cyst, no varicocele, no hydrocele right testes.     Urethral Meatus: Normal size. No lesion, no wart, no discharge, no polyp. Normal location.     Penis: Circumcised, no warts, no cracks. No dorsal Peyronie's plaques, no left corporal Peyronie's plaques, no right corporal Peyronie's plaques, no scarring, no warts. No balanitis, no meatal stenosis.     Prostate: 40 gram or 2+ size. Left lobe normal consistency, right lobe normal consistency. Symmetrical lobes. No prostate nodule. Left lobe no tenderness, right lobe no tenderness.     Seminal Vesicles: Nonpalpable.     Sphincter Tone: Normal sphincter. No rectal tenderness. No rectal mass.      MULTI-SYSTEM PHYSICAL EXAMINATION:      Constitutional: Well-nourished. No physical deformities. Normally developed. Good grooming.     Neck: Neck symmetrical, not swollen. Normal tracheal position.     Respiratory: No labored breathing, no use of accessory muscles.      Cardiovascular: Normal temperature, normal extremity pulses, no swelling, no varicosities.     Lymphatic: No enlargement of neck, axillae,  groin.     Skin: No paleness, no jaundice, no cyanosis. No lesion, no ulcer, no rash.     Neurologic / Psychiatric: Oriented to time, oriented to place, oriented to person. No depression, no anxiety, no agitation.     Gastrointestinal: No mass, no tenderness, no rigidity, non obese abdomen.     Eyes: Normal conjunctivae. Normal eyelids.     Ears, Nose, Mouth, and Throat: Left ear no scars, no lesions, no masses. Right ear no scars, no lesions, no masses. Nose no scars, no lesions, no masses. Normal hearing. Normal lips.     Musculoskeletal: Normal gait and station of head and neck.            PAST DATA REVIEW: None   PROCEDURES:    Flexible Cystoscopy - 52000  Risks,  benefits, and some of the potential complications of the procedure were discussed at length with the patient including infection, bleeding, voiding discomfort, urinary retention, fever, chills, sepsis, and others. All questions were answered. Informed consent was obtained. Antibiotic prophylaxis was given. Sterile technique and intraurethral analgesia were used.  Meatus:  Normal size. Normal location. Normal condition.  Urethra:  No strictures.  External Sphincter:  Normal.  Verumontanum:  Normal.  Prostate:  Non-obstructing. No hyperplasia.  Bladder Neck:  Non-obstructing.  Ureteral Orifices:  Normal location. Normal size. Normal shape. Effluxed clear urine.  Bladder:  Cysto performed today and shows : What appears to be 2 small recurrent papillary tumors superior and medial to the left ureteral orifice. No other recurrent lesions noted. Lesions were about 1 cm in size.      The lower urinary tract was carefully examined. The procedure was well-tolerated and without complications. Antibiotic instructions were given. Instructions were given to call the office immediately for bloody urine, difficulty urinating, urinary retention, painful or frequent urination, fever, chills, nausea, vomiting or other illness. The patient stated that he understood these instructions and would comply with them.    Urinalysis - 81003  Dipstick Dipstick Cont'd  Color: Yellow Bilirubin: Neg mg/dL  Appearance: Clear Ketones: Neg mg/dL  Specific Gravity: 1.025 Blood: Neg ery/uL  pH: 5.5 Protein: Trace mg/dL  Glucose: Neg mg/dL Urobilinogen: 0.2 mg/dL   Nitrites: Neg   Leukocyte Esterase: Neg leu/uL    ASSESSMENT:     ICD-10 Details  1 GU:  Bladder Cancer Lateral - Q46.9 Acute, Complicated Injury   PLAN:   Document  Letter(s):  Created for Patient: Clinical Summary   Notes:  TURBT consent: I have discussed with the patient the risks, benefits of TURBT which include but are not limited to: Bleeding,  infection, damage to the bladder with potential perforation of the bladder, damage to surrounding organs, possible need for further procedures including open repair and catheterization, possibility of nonhealing area within the bladder, urgency, frequency which may be refractory to medications. I pointed out that in some occasions after resection of the bladder tumor, mitomycin-C chemotherapy may be instilled into the bladder. The risks associated with this therapy include but are not limited to: Refractory or new onset urgency, frequency, dysuria, infrequently severe systemic side effects secondary to mitomycin-C. After full discussion of the risks, benefits and alternatives, the patient has consented to the above procedure and desires to proceed.

## 2020-05-18 ENCOUNTER — Ambulatory Visit (HOSPITAL_BASED_OUTPATIENT_CLINIC_OR_DEPARTMENT_OTHER): Payer: Medicare Other | Admitting: Anesthesiology

## 2020-05-18 ENCOUNTER — Encounter (HOSPITAL_BASED_OUTPATIENT_CLINIC_OR_DEPARTMENT_OTHER)
Admission: RE | Disposition: A | Discharge status: Home / Self Care | Payer: Self-pay | Source: Home / Self Care | Attending: Urology

## 2020-05-18 ENCOUNTER — Ambulatory Visit (HOSPITAL_BASED_OUTPATIENT_CLINIC_OR_DEPARTMENT_OTHER)
Admission: RE | Admit: 2020-05-18 | Discharge: 2020-05-18 | Disposition: A | Discharge status: Home / Self Care | Payer: Medicare Other | Attending: Urology | Admitting: Urology

## 2020-05-18 ENCOUNTER — Encounter (HOSPITAL_BASED_OUTPATIENT_CLINIC_OR_DEPARTMENT_OTHER): Payer: Self-pay | Admitting: Urology

## 2020-05-18 DIAGNOSIS — C679 Malignant neoplasm of bladder, unspecified: Secondary | ICD-10-CM | POA: Diagnosis not present

## 2020-05-18 DIAGNOSIS — Z87891 Personal history of nicotine dependence: Secondary | ICD-10-CM | POA: Insufficient documentation

## 2020-05-18 DIAGNOSIS — K5900 Constipation, unspecified: Secondary | ICD-10-CM | POA: Diagnosis not present

## 2020-05-18 DIAGNOSIS — Z79899 Other long term (current) drug therapy: Secondary | ICD-10-CM | POA: Insufficient documentation

## 2020-05-18 DIAGNOSIS — D414 Neoplasm of uncertain behavior of bladder: Secondary | ICD-10-CM | POA: Diagnosis not present

## 2020-05-18 DIAGNOSIS — F32A Depression, unspecified: Secondary | ICD-10-CM | POA: Diagnosis not present

## 2020-05-18 DIAGNOSIS — N3289 Other specified disorders of bladder: Secondary | ICD-10-CM | POA: Diagnosis not present

## 2020-05-18 DIAGNOSIS — D696 Thrombocytopenia, unspecified: Secondary | ICD-10-CM | POA: Diagnosis not present

## 2020-05-18 HISTORY — DX: Presence of spectacles and contact lenses: Z97.3

## 2020-05-18 HISTORY — DX: Depression, unspecified: F32.A

## 2020-05-18 HISTORY — DX: Other seasonal allergic rhinitis: J30.2

## 2020-05-18 HISTORY — DX: Constipation, unspecified: K59.00

## 2020-05-18 HISTORY — PX: TRANSURETHRAL RESECTION OF BLADDER TUMOR: SHX2575

## 2020-05-18 HISTORY — DX: Anesthesia of skin: R20.0

## 2020-05-18 HISTORY — DX: Lymphocytosis (symptomatic): D72.820

## 2020-05-18 HISTORY — DX: Personal history of other medical treatment: Z92.89

## 2020-05-18 HISTORY — PX: CYSTOSCOPY W/ RETROGRADES: SHX1426

## 2020-05-18 SURGERY — TURBT (TRANSURETHRAL RESECTION OF BLADDER TUMOR)
Anesthesia: General | Site: Bladder

## 2020-05-18 MED ORDER — LIDOCAINE 2% (20 MG/ML) 5 ML SYRINGE
INTRAMUSCULAR | Status: AC
  Filled 2020-05-18: qty 5

## 2020-05-18 MED ORDER — ONDANSETRON HCL 4 MG/2ML IJ SOLN
INTRAMUSCULAR | Status: DC | PRN
  Administered 2020-05-18: 4 mg via INTRAVENOUS

## 2020-05-18 MED ORDER — BELLADONNA ALKALOIDS-OPIUM 16.2-60 MG RE SUPP
RECTAL | Status: AC
  Filled 2020-05-18: qty 1

## 2020-05-18 MED ORDER — MIDAZOLAM HCL 2 MG/2ML IJ SOLN
INTRAMUSCULAR | Status: AC
  Filled 2020-05-18: qty 2

## 2020-05-18 MED ORDER — ONDANSETRON HCL 4 MG/2ML IJ SOLN
INTRAMUSCULAR | Status: AC
  Filled 2020-05-18: qty 2

## 2020-05-18 MED ORDER — PROMETHAZINE HCL 25 MG/ML IJ SOLN: 6.2500 mg | INTRAMUSCULAR | Status: DC | PRN

## 2020-05-18 MED ORDER — ACETAMINOPHEN 500 MG PO TABS
1000.0000 mg | ORAL_TABLET | Freq: Once | ORAL | Status: AC
  Administered 2020-05-18: 1000 mg via ORAL

## 2020-05-18 MED ORDER — CEFAZOLIN SODIUM-DEXTROSE 2-4 GM/100ML-% IV SOLN
INTRAVENOUS | Status: AC
  Filled 2020-05-18: qty 100

## 2020-05-18 MED ORDER — IOHEXOL 300 MG/ML  SOLN
INTRAMUSCULAR | Status: DC | PRN
  Administered 2020-05-18: 10 mL via URETHRAL

## 2020-05-18 MED ORDER — STERILE WATER FOR IRRIGATION IR SOLN
Status: DC | PRN
  Administered 2020-05-18: 2000 mL

## 2020-05-18 MED ORDER — TRAMADOL HCL 50 MG PO TABS: 50.0000 mg | ORAL_TABLET | Freq: Four times a day (QID) | ORAL | 0 refills | Status: DC | PRN

## 2020-05-18 MED ORDER — FENTANYL CITRATE (PF) 100 MCG/2ML IJ SOLN
INTRAMUSCULAR | Status: AC
  Filled 2020-05-18: qty 2

## 2020-05-18 MED ORDER — PROPOFOL 500 MG/50ML IV EMUL
INTRAVENOUS | Status: AC
  Filled 2020-05-18: qty 50

## 2020-05-18 MED ORDER — LACTATED RINGERS IV SOLN: INTRAVENOUS | Status: DC

## 2020-05-18 MED ORDER — BELLADONNA ALKALOIDS-OPIUM 16.2-60 MG RE SUPP
RECTAL | Status: DC | PRN
  Administered 2020-05-18: 1 via RECTAL

## 2020-05-18 MED ORDER — FENTANYL CITRATE (PF) 100 MCG/2ML IJ SOLN
INTRAMUSCULAR | Status: DC | PRN
  Administered 2020-05-18: 50 ug via INTRAVENOUS

## 2020-05-18 MED ORDER — ACETAMINOPHEN 500 MG PO TABS
ORAL_TABLET | ORAL | Status: AC
  Filled 2020-05-18: qty 2

## 2020-05-18 MED ORDER — CEFAZOLIN SODIUM-DEXTROSE 2-4 GM/100ML-% IV SOLN
2.0000 g | INTRAVENOUS | Status: AC
  Administered 2020-05-18: 2 g via INTRAVENOUS

## 2020-05-18 MED ORDER — PROPOFOL 10 MG/ML IV BOLUS
INTRAVENOUS | Status: DC | PRN
  Administered 2020-05-18: 160 mg via INTRAVENOUS

## 2020-05-18 MED ORDER — DEXAMETHASONE SODIUM PHOSPHATE 10 MG/ML IJ SOLN
INTRAMUSCULAR | Status: AC
  Filled 2020-05-18: qty 1

## 2020-05-18 MED ORDER — FENTANYL CITRATE (PF) 100 MCG/2ML IJ SOLN: 25.0000 ug | INTRAMUSCULAR | Status: DC | PRN

## 2020-05-18 MED ORDER — OXYCODONE HCL 5 MG/5ML PO SOLN: 5.0000 mg | Freq: Once | ORAL | Status: DC | PRN

## 2020-05-18 MED ORDER — OXYCODONE HCL 5 MG PO TABS
5.0000 mg | ORAL_TABLET | Freq: Once | ORAL | Status: DC | PRN
Start: 2020-05-18 — End: 2020-05-18

## 2020-05-18 MED ORDER — MIDAZOLAM HCL 5 MG/5ML IJ SOLN
INTRAMUSCULAR | Status: DC | PRN
  Administered 2020-05-18: 2 mg via INTRAVENOUS

## 2020-05-18 MED ORDER — LIDOCAINE 2% (20 MG/ML) 5 ML SYRINGE
INTRAMUSCULAR | Status: DC | PRN
  Administered 2020-05-18: 100 mg via INTRAVENOUS

## 2020-05-18 MED ORDER — DEXAMETHASONE SODIUM PHOSPHATE 4 MG/ML IJ SOLN
INTRAMUSCULAR | Status: DC | PRN
  Administered 2020-05-18: 8 mg via INTRAVENOUS

## 2020-05-18 SURGICAL SUPPLY — 39 items
BAG DRAIN URO-CYSTO SKYTR STRL (DRAIN) ×3 IMPLANT
BAG DRN RND TRDRP ANRFLXCHMBR (UROLOGICAL SUPPLIES)
BAG DRN UROCATH (DRAIN) ×2
BAG URINE DRAIN 2000ML AR STRL (UROLOGICAL SUPPLIES) IMPLANT
BAG URINE LEG 500ML (DRAIN) IMPLANT
BULB IRRIG PATHFIND (MISCELLANEOUS) IMPLANT
CATH FOLEY 2WAY SLVR  5CC 20FR (CATHETERS)
CATH FOLEY 2WAY SLVR  5CC 22FR (CATHETERS)
CATH FOLEY 2WAY SLVR 5CC 20FR (CATHETERS) IMPLANT
CATH FOLEY 2WAY SLVR 5CC 22FR (CATHETERS) IMPLANT
CATH URET 5FR 28IN CONE TIP (BALLOONS)
CATH URET 5FR 28IN OPEN ENDED (CATHETERS) ×3 IMPLANT
CATH URET 5FR 70CM CONE TIP (BALLOONS) IMPLANT
CLOTH BEACON ORANGE TIMEOUT ST (SAFETY) ×3 IMPLANT
ELECT REM PT RETURN 9FT ADLT (ELECTROSURGICAL) ×3
ELECTRODE REM PT RTRN 9FT ADLT (ELECTROSURGICAL) ×2 IMPLANT
EVACUATOR MICROVAS BLADDER (UROLOGICAL SUPPLIES) IMPLANT
FIBER LASER FLEXIVA 365 (UROLOGICAL SUPPLIES) IMPLANT
GLOVE SURG ENC MOIS LTX SZ6.5 (GLOVE) ×6 IMPLANT
GLOVE SURG ENC MOIS LTX SZ7.5 (GLOVE) ×3 IMPLANT
GOWN STRL REUS W/ TWL LRG LVL3 (GOWN DISPOSABLE) ×2 IMPLANT
GOWN STRL REUS W/TWL LRG LVL3 (GOWN DISPOSABLE) ×6 IMPLANT
GUIDEWIRE ANG ZIPWIRE 038X150 (WIRE) IMPLANT
GUIDEWIRE STR DUAL SENSOR (WIRE) ×3 IMPLANT
HOLDER FOLEY CATH W/STRAP (MISCELLANEOUS) IMPLANT
IV NS IRRIG 3000ML ARTHROMATIC (IV SOLUTION) IMPLANT
KIT TURNOVER CYSTO (KITS) ×3 IMPLANT
LOOP MONOPOLAR YLW (ELECTROSURGICAL) IMPLANT
MANIFOLD NEPTUNE II (INSTRUMENTS) IMPLANT
NS IRRIG 500ML POUR BTL (IV SOLUTION) ×3 IMPLANT
PACK CYSTO (CUSTOM PROCEDURE TRAY) ×3 IMPLANT
PLUG CATH AND CAP STER (CATHETERS) IMPLANT
SYR 20ML LL LF (SYRINGE) ×3 IMPLANT
SYR TOOMEY IRRIG 70ML (MISCELLANEOUS)
SYRINGE TOOMEY IRRIG 70ML (MISCELLANEOUS) IMPLANT
TRACTIP FLEXIVA PULS ID 200XHI (Laser) IMPLANT
TRACTIP FLEXIVA PULSE ID 200 (Laser)
TUBE CONNECTING 12X1/4 (SUCTIONS) IMPLANT
WATER STERILE IRR 3000ML UROMA (IV SOLUTION) ×3 IMPLANT

## 2020-05-18 NOTE — Transfer of Care (Signed)
Immediate Anesthesia Transfer of Care Note  Patient: Tyler Calderon  Procedure(s) Performed: BLADDER BIOPSY OF BLADDER TUMOR WITH FULGARATION (N/A Bladder) CYSTOSCOPY WITH RETROGRADE PYELOGRAM (Bilateral Bladder)  Patient Location: PACU  Anesthesia Type:General  Level of Consciousness: awake and alert   Airway & Oxygen Therapy: Patient Spontanous Breathing and Patient connected to face mask oxygen  Post-op Assessment: Report given to RN and Post -op Vital signs reviewed and stable  Post vital signs: Reviewed and stable  Last Vitals:  Vitals Value Taken Time  BP 132/75 05/18/20 0801  Temp    Pulse 64 05/18/20 0802  Resp 12 05/18/20 0802  SpO2 95 % 05/18/20 0802  Vitals shown include unvalidated device data.  Last Pain:  Vitals:   05/18/20 0607  TempSrc: Oral  PainSc: 0-No pain      Patients Stated Pain Goal: 5 (23/34/35 6861)  Complications: No complications documented.

## 2020-05-18 NOTE — Op Note (Signed)
Preoperative diagnosis:  1.  Recurrent bladder cancer  Postoperative diagnosis: 1.  Same  Procedure(s): 1.  Cystoscopy, bilateral retrograde pyelogram with intraoperative interpretation, transurethral section of bladder tumor (medium  Surgeon: Dr. Harold Barban  Anesthesia: General  Complications: None  EBL: Minimal  Specimens: Bladder tumor  Disposition of specimens: To pathology  Intraoperative findings: Patient had 2 approximate 1 cm somewhat flat erythematous area superior and lateral to the left ureteral orifice.  Completely removed utilizing rigid biopsy forceps and base fulgurated.  Total surface area was 2 to 3 cm.  Patient had markedly narrow right ureteral orifice in the area of prior TURBT required wire to cannulate for retrograde.  Bilateral retrogrades were normal.  Indication: Patient is a 70 year old [place] male with history of high-grade transitional cell carcinoma the bladder.  He is found on surveillance cystoscopy to have recurrent lesion superior lateral to the left ureteral orifice.  Presents at this time undergo cystoscopy retrogrades and TURBT.  Description of procedure:  After obtaining informed consent for the patient is taken the major cystoscopy suite placed under general anesthesia.  Placed in dorsolithotomy position genitalia prepped and draped in usual sterile fashion.  Proper pause and timeout was performed for procedure.  43 Fransico was advanced in the bladder without difficulty.  Prostate had moderate bilobar prostatic hypertrophy approximate 2 and half to 3 cm prostatic urethral length.  Bladder was thoroughly spread with 30 and 70 degree lenses with above-noted findings.  The patient was noted to have very small right ureteral orifice due to prior TURBT.  I utilized a 5 [place] open tip catheter and sensor wire to cannulate the right ureteral orifice initially with the wire and then advanced open tip catheter over the wire and subsequently dilated the  ureteral orifice.  Retrograde pyelogram revealed good filling of the right upper tract without evidence of filling defect or hydronephrosis.  The right upper tract emptied out promptly upon removal of the retrograde catheter.  In similar fashion left ureteral orifice was cannulated and retrograde performed.  Normal findings on the left retrograde without evidence of filling defect or hydronephrosis.  Attention was then directed towards removal of the bladder lesions.  It was felt that in order to preserve pathologic integrity from cautery artifact that these areas would be removed utilizing rigid biopsy forceps rather than loop resection.  Utilizing rigid biopsy forceps the 2 areas were subsequently removed completely with deep tissue included in the removal of the lesions.  The areas were then fulgurated with Bugbee electrode with good hemostasis noted.  There is no involvement of the left ureteral orifice as they were superior and lateral.  Bladder was emptied and then refilled with reinspection of the resection site and good hemostasis again noted.  Bladder emptied and the cystoscope removed.  BNO suppositories placed for postoperative bladder spasm.  Procedure was terminated he was awakened from anesthesia and taken back to the recovery room in stable condition.  No immediate complication from the procedure.

## 2020-05-18 NOTE — Discharge Instructions (Signed)
CYSTOSCOPY HOME CARE INSTRUCTIONS  Activity: Rest for the remainder of the day.  Do not drive or operate equipment today.  You may resume normal activities in one to two days as instructed by your physician.   Meals: Drink plenty of liquids and eat light foods such as gelatin or soup this evening.  You may return to a normal meal plan tomorrow.  Return to Work: You may return to work in one to two days or as instructed by your physician.  Special Instructions / Symptoms: Call your physician if any of these symptoms occur:   -persistent or heavy bleeding  -bleeding which continues after first few urination  -large blood clots that are difficult to pass  -urine stream diminishes or stops completely  -fever equal to or higher than 101 degrees Farenheit.  -cloudy urine with a strong, foul odor  -severe pain  Females should always wipe from front to back after elimination.  You may feel some burning pain when you urinate.  This should disappear with time.  Applying moist heat to the lower abdomen or a hot tub bath may help relieve the pain. \  Follow-Up / Date of Return Visit to Your Physician:  As instructed Call for an appointment to arrange follow-up.   Post Anesthesia Home Care Instructions  Activity: Get plenty of rest for the remainder of the day. A responsible individual must stay with you for 24 hours following the procedure.  For the next 24 hours, DO NOT: -Drive a car -Operate machinery -Drink alcoholic beverages -Take any medication unless instructed by your physician -Make any legal decisions or sign important papers.  Meals: Start with liquid foods such as gelatin or soup. Progress to regular foods as tolerated. Avoid greasy, spicy, heavy foods. If nausea and/or vomiting occur, drink only clear liquids until the nausea and/or vomiting subsides. Call your physician if vomiting continues.  Special Instructions/Symptoms: Your throat may feel dry or sore from the  anesthesia or the breathing tube placed in your throat during surgery. If this causes discomfort, gargle with warm salt water. The discomfort should disappear within 24 hours.  If you had a scopolamine patch placed behind your ear for the management of post- operative nausea and/or vomiting:  1. The medication in the patch is effective for 72 hours, after which it should be removed.  Wrap patch in a tissue and discard in the trash. Wash hands thoroughly with soap and water. 2. You may remove the patch earlier than 72 hours if you experience unpleasant side effects which may include dry mouth, dizziness or visual disturbances. 3. Avoid touching the patch. Wash your hands with soap and water after contact with the patch.        

## 2020-05-18 NOTE — Interval H&P Note (Signed)
History and Physical Interval Note:  05/18/2020 7:17 AM  Tyler Calderon  has presented today for surgery, with the diagnosis of BLADDER CANCER.  The various methods of treatment have been discussed with the patient and family. After consideration of risks, benefits and other options for treatment, the patient has consented to  Procedure(s) with comments: TRANSURETHRAL RESECTION OF BLADDER TUMOR (TURBT) (N/A) - 30 MINS CYSTOSCOPY WITH RETROGRADE PYELOGRAM (Bilateral) as a surgical intervention.  The patient's history has been reviewed, patient examined, no change in status, stable for surgery.  I have reviewed the patient's chart and labs.  Questions were answered to the patient's satisfaction.     [name]

## 2020-05-18 NOTE — Anesthesia Postprocedure Evaluation (Signed)
Anesthesia Post Note  Patient: Tyler Calderon  Procedure(s) Performed: BLADDER BIOPSY OF BLADDER TUMOR WITH FULGARATION (N/A Bladder) CYSTOSCOPY WITH RETROGRADE PYELOGRAM (Bilateral Bladder)     Patient location during evaluation: PACU Anesthesia Type: General Level of consciousness: awake and alert and oriented Pain management: pain level controlled Vital Signs Assessment: post-procedure vital signs reviewed and stable Respiratory status: spontaneous breathing, nonlabored ventilation and respiratory function stable Cardiovascular status: blood pressure returned to baseline Postop Assessment: no apparent nausea or vomiting Anesthetic complications: no   No complications documented.             Brennan Bailey

## 2020-05-18 NOTE — Anesthesia Procedure Notes (Signed)
Procedure Name: LMA Insertion Date/Time: 05/18/2020 7:24 AM Performed by: Lieutenant Diego, CRNA Pre-anesthesia Checklist: Patient identified, Emergency Drugs available, Suction available and Patient being monitored Patient Re-evaluated:Patient Re-evaluated prior to induction Oxygen Delivery Method: Circle system utilized Preoxygenation: Pre-oxygenation with 100% oxygen Induction Type: IV induction Ventilation: Mask ventilation without difficulty LMA: LMA inserted LMA Size: 5.0 Number of attempts: 1 Placement Confirmation: positive ETCO2 and breath sounds checked- equal and bilateral Tube secured with: Tape Dental Injury: Teeth and Oropharynx as per pre-operative assessment

## 2020-05-19 ENCOUNTER — Encounter (HOSPITAL_BASED_OUTPATIENT_CLINIC_OR_DEPARTMENT_OTHER): Payer: Self-pay | Admitting: Urology

## 2020-05-19 LAB — SURGICAL PATHOLOGY

## 2020-05-31 DIAGNOSIS — C672 Malignant neoplasm of lateral wall of bladder: Secondary | ICD-10-CM | POA: Diagnosis not present

## 2020-06-09 DIAGNOSIS — N400 Enlarged prostate without lower urinary tract symptoms: Secondary | ICD-10-CM | POA: Diagnosis not present

## 2020-06-09 DIAGNOSIS — E7800 Pure hypercholesterolemia, unspecified: Secondary | ICD-10-CM | POA: Diagnosis not present

## 2020-06-09 DIAGNOSIS — N401 Enlarged prostate with lower urinary tract symptoms: Secondary | ICD-10-CM | POA: Diagnosis not present

## 2020-06-25 DIAGNOSIS — Z23 Encounter for immunization: Secondary | ICD-10-CM | POA: Diagnosis not present

## 2020-09-06 DIAGNOSIS — C672 Malignant neoplasm of lateral wall of bladder: Secondary | ICD-10-CM | POA: Diagnosis not present

## 2020-09-08 ENCOUNTER — Other Ambulatory Visit: Payer: Self-pay | Admitting: Urology

## 2020-09-09 ENCOUNTER — Encounter (HOSPITAL_BASED_OUTPATIENT_CLINIC_OR_DEPARTMENT_OTHER): Payer: Self-pay | Admitting: Urology

## 2020-09-10 ENCOUNTER — Other Ambulatory Visit: Payer: Self-pay

## 2020-09-10 ENCOUNTER — Encounter (HOSPITAL_BASED_OUTPATIENT_CLINIC_OR_DEPARTMENT_OTHER): Payer: Self-pay | Admitting: Urology

## 2020-09-10 NOTE — H&P (Signed)
Referral from Dr. Matilde Sprang. Patient had an episode of gross hematuria. For this, he underwent a CT of the abdomen and pelvis with contrast on 06/19/2019. This revealed enhancing bladder mass that was about 1.5 cm on CT. This was confirmed cystoscopically by Dr. Matilde Sprang. Patient presents to discuss TURBT. He states his primary care physician is checking his PSA. Prostate exam documented as normal by his other urologist. He declines rechecking PSA today.   08/29/2019  Patient is status post TURBT. He has some abdominal pain yesterday and some right-sided pain but this has resolved today. If it recurs, we plan for a renal ultrasound. Resection was close to the ureteral orifice but unlikely that it was fulgurated. He denies any voiding complaints currently. 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 All tumor was resected.  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 Gloriann Loan. Underwent intravesical BCG induction therapy which completed on 10/28/2019. Here now for follow-up surveillance cystoscopy.  Cysto performed today and shows: Several areas of patchy erythema throughout the urinary bladder consistent with either cystitis versus possible BCG reaction. No isolated tumor noted.  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 Gloriann Loan. Subsequently underwent intravesical BCG induction therapy completed 10/28/2019. Follow-up cysto on 11/27/2019 showed some patchy erythematous areas which was felt to be possible BCG affect and is now scheduled for follow-up surveillance cysto. No interim GU issues. Should be noted that urine culture had last office visit grew out Staph UTI and was treated with Keflex.  Micro urinalysis today is clear on urine spun sediment.  Cystoscopy is performed today and  shows: No evidence of recurrent mucosal lesions pristine bladder mucosa.  -04/30/20-patient with history of high-grade superficial transitional cell carcinoma bladder originally resected in August of 2021 followed by intravesical BCG completed in October 2021. Recent follow-up surveillance cysto was negative in January of 2022. Here for follow-up surveillance cysto. No interim GU issues.  Micro urinalysis  Cysto performed today and shows : What appears to be 2 small recurrent papillary tumors superior and medial to the left ureteral orifice. No other recurrent lesions noted. Lesions were about 1 cm in size.  -05/31/20-patient with history of bladder cancer originally resected in August of 2021 followed by intravesical BCG completed in October of 2021. Had recent cysto on 04/30/2020 which revealed appeared to be recurrent tumor areas. Underwent recent cysto and TURBT on 05/18/2020. The pathology report showed urothelial dysplasia without evidence of occult carcinoma. Suggestive of premalignant lesion. No invasion noted. Patient has had no postoperative issues.  -8/29/222-PATIENT WITH HISTORY OF HIGH-GRADE SUPERFICIAL TRANSITIONAL CELL CARCINOMA BLADDER ORIGINALLY DISSECTED IN [place] OF 2021. Had what appeared to be recurrent lesions which were biopsied and showed urothelial dysplasia on 05/18/2020. No interim GU issues. Here for follow-up surveillance cysto.  Cysto is performed today and shows: What appears to be a papillary recurrence at the 12 o'clock position at the bladder neck. This could be seen on entering the bladder also with retroflexion of the scope. The remainder of the bladder. Grossly normal without obvious recurrence.  Micro urinalysis     ALLERGIES: No Known Drug Allergies    MEDICATIONS: Simvastatin 20 mg tablet  Tamsulosin Hcl 0.4 mg capsule  Aller-Tec 10 mg tablet  Clear Eyes Itchy Eye Relief  Lorazepam 0.5 mg tablet  Montelukast Sodium 10 mg tablet  Multivitamin  Nasacort 55 mcg  aerosol, spray  Notes: Patient ran out of diazepam for when he gets his cysto and bcg's procedure/tx.   GU PSH: Bladder Instill AntiCA Agent - 10/28/2019, 10/21/2019, 10/14/2019, 10/07/2019, 09/30/2019, 09/23/2019, 08/22/2019 Cysto Dilate Stricture (M or F) - 08/22/2019 Cystoscopy - 04/30/2020, 02/05/2020, 11/27/2019, 06/26/2019 Cystoscopy TURBT 2-5 cm - 05/18/2020, 08/22/2019     NON-GU PSH: Back surgery Shoulder Surgery (Unspecified)         GU PMH: Bladder Cancer Lateral - 05/31/2020, - 04/30/2020, - 02/05/2020, - 11/27/2019, - 10/28/2019, - 10/21/2019, - 10/14/2019, - 10/07/2019, - 09/30/2019, - 09/23/2019, - 08/29/2019 Bladder tumor/neoplasm - 07/17/2019 Gross hematuria - 06/26/2019 Nocturia - 06/26/2019 Urinary Frequency - 06/26/2019    NON-GU PMH: Pyuria/other UA findings - 11/27/2019 Hypercholesterolemia    FAMILY HISTORY: Congestive Heart Failure - Father Death In The Family Mother - Mother Diabetes - Uncle   SOCIAL HISTORY: Marital Status: Married Ethnicity: Not Hispanic Or Latino; Race: Seiple Current Smoking Status: Patient does not smoke anymore. Has not smoked since 06/09/1981. Smoked for 12 years. Smoked 1 pack per day.  <DIV'  Tobacco Use Assessment Completed:  Used Tobacco in last 30 days?   Drinks 4 drinks per day.  Does not use drugs. Drinks 2 caffeinated drinks per day.    REVIEW OF SYSTEMS:     GU Review Calderon:  Patient denies frequent urination, hard to postpone urination, burning/ pain with urination, get up at night to urinate, leakage of urine, stream starts and stops, trouble starting your stream, have to strain to urinate , erection problems, and penile pain.    Gastrointestinal (Upper):  Patient denies indigestion/ heartburn, nausea, and vomiting.    Gastrointestinal (Lower):  Patient denies diarrhea and constipation.    Constitutional:  Patient denies fever, night sweats, weight loss, and fatigue.    Skin:  Patient denies skin rash/ lesion and itching.    Eyes:   Patient denies blurred vision and double vision.    Ears/ Nose/ Throat:  Patient denies sore throat and sinus problems.    Hematologic/Lymphatic:  Patient denies swollen glands and easy bruising.    Cardiovascular:  Patient denies leg swelling and chest pains.    Respiratory:  Patient denies cough and shortness of breath.    Endocrine:  Patient denies excessive thirst.    Musculoskeletal:  Patient denies back pain and joint pain.    Neurological:  Patient denies headaches and dizziness.    Psychologic:  Patient denies depression and anxiety.    VITAL SIGNS: None     GU PHYSICAL EXAMINATION:      Urethral Meatus: Normal size. No lesion, no wart, no discharge, no polyp. Normal location.     Penis: Circumcised, no warts, no cracks. No dorsal Peyronie's plaques, no left corporal Peyronie's plaques, no right corporal Peyronie's plaques, no scarring, no warts. No balanitis, no meatal stenosis.     MULTI-SYSTEM PHYSICAL EXAMINATION:      Constitutional: Well-nourished. No physical deformities. Normally developed. Good grooming.     Neck: Neck symmetrical, not swollen. Normal tracheal position.     Respiratory: No labored breathing, no use of accessory muscles.      Cardiovascular: Normal temperature, normal extremity pulses, no swelling, no varicosities.     Lymphatic: No enlargement of neck, axillae, groin.     Skin: No paleness, no jaundice, no cyanosis. No lesion, no ulcer, no rash.     Neurologic / Psychiatric: Oriented to time, oriented to place, oriented to person. No depression, no anxiety, no agitation.  Eyes: Normal conjunctivae. Normal eyelids.     Ears, Nose, Mouth, and Throat: Left ear no scars, no lesions, no masses. Right ear no scars, no lesions, no masses. Nose no scars, no lesions, no masses. Normal hearing. Normal lips.     Musculoskeletal: Normal gait and station of head and neck.            PAST DATA REVIEW: None   PROCEDURES:    Flexible Cystoscopy - 52000  Risks,  benefits, and some of the potential complications of the procedure were discussed at length with the patient including infection, bleeding, voiding discomfort, urinary retention, fever, chills, sepsis, and others. All questions were answered. Informed consent was obtained. Antibiotic prophylaxis was given. Sterile technique and intraurethral analgesia were used.  Meatus:  Normal size. Normal location. Normal condition.  Urethra:  No strictures.  External Sphincter:  Normal.  Verumontanum:  Normal.  Prostate:  Non-obstructing. No hyperplasia.  Bladder Neck:  Non-obstructing.  Ureteral Orifices:  Normal location. Normal size. Normal shape. Effluxed clear urine.  Bladder:  Cysto is performed today and shows: What appears to be a papillary recurrence at the 12 o'clock position at the bladder neck. This could be seen on entering the bladder also with retroflexion of the scope. The remainder of the bladder. Grossly normal without obvious recurrence.      The lower urinary tract was carefully examined. The procedure was well-tolerated and without complications. Antibiotic instructions were given. Instructions were given to call the office immediately for bloody urine, difficulty urinating, urinary retention, painful or frequent urination, fever, chills, nausea, vomiting or other illness. The patient stated that he understood these instructions and would comply with them.    Urinalysis - 81003  Dipstick Dipstick Cont'd  Color: Yellow Bilirubin: Neg mg/dL  Appearance: Clear Ketones: Neg mg/dL  Specific Gravity: 1.025 Blood: Neg ery/uL  pH: 6.0 Protein: Neg mg/dL  Glucose: Neg mg/dL Urobilinogen: 0.2 mg/dL   Nitrites: Neg   Leukocyte Esterase: Neg leu/uL    ASSESSMENT:     ICD-10 Details  1 GU:  Bladder Cancer Lateral - [id number] Acute, Complicated Injury   PLAN:   Document  Letter(s):  Created for Patient: Clinical Summary   Notes:  Discussed cystoscopic findings with the patient. Recommended  cysto bilateral retrogrades and TURBT. Risks and benefits discussed as outlined below. Will schedule accordingly in the near future.  TURBT consent: I have discussed with the patient the risks, benefits of TURBT which include but are not limited to: Bleeding, infection, damage to the bladder with potential perforation of the bladder, damage to surrounding organs, possible need for further procedures including open repair and catheterization, possibility of nonhealing area within the bladder, urgency, frequency which may be refractory to medications. I pointed out that in some occasions after resection of the bladder tumor, mitomycin-C chemotherapy may be instilled into the bladder. The risks associated with this therapy include but are not limited to: Refractory or new onset urgency, frequency, dysuria, infrequently severe systemic side effects secondary to mitomycin-C. After full discussion of the risks, benefits and alternatives, the patient has consented to the above procedure and desires to proceed.

## 2020-09-10 NOTE — Progress Notes (Addendum)
Spoke w/ via phone for pre-op interview---pt Lab needs dos----   none , surgery orders need 2nd sign           Lab results------none COVID test -----patient states asymptomatic no test needed Arrive at -------915 am 09-14-2020 NPO after MN NO Solid Food.  Clear liquids from MN until---815 am Med rec completed Medications to take morning of surgery -----nasacort Diabetic medication -----n/a Patient instructed to bring photo id and insurance card day of surgery Patient aware to have Driver (ride ) / caregiver  wife Rosann Auerbach   for 24 hours after surgery  Patient Special Instructions -----none Pre-Op special Istructions -----none Patient verbalized understanding of instructions that were given at this phone interview. Patient denies shortness of breath, chest pain, fever, cough at this phone interview.

## 2020-09-14 ENCOUNTER — Other Ambulatory Visit: Payer: Self-pay

## 2020-09-14 ENCOUNTER — Ambulatory Visit (HOSPITAL_BASED_OUTPATIENT_CLINIC_OR_DEPARTMENT_OTHER): Payer: Medicare Other | Admitting: Anesthesiology

## 2020-09-14 ENCOUNTER — Encounter (HOSPITAL_BASED_OUTPATIENT_CLINIC_OR_DEPARTMENT_OTHER)
Admission: RE | Disposition: A | Discharge status: Home / Self Care | Payer: Self-pay | Source: Home / Self Care | Attending: Urology

## 2020-09-14 ENCOUNTER — Encounter (HOSPITAL_BASED_OUTPATIENT_CLINIC_OR_DEPARTMENT_OTHER): Payer: Self-pay | Admitting: Urology

## 2020-09-14 ENCOUNTER — Ambulatory Visit (HOSPITAL_BASED_OUTPATIENT_CLINIC_OR_DEPARTMENT_OTHER)
Admission: RE | Admit: 2020-09-14 | Discharge: 2020-09-14 | Disposition: A | Discharge status: Home / Self Care | Payer: Medicare Other | Attending: Urology | Admitting: Urology

## 2020-09-14 DIAGNOSIS — C679 Malignant neoplasm of bladder, unspecified: Secondary | ICD-10-CM | POA: Diagnosis not present

## 2020-09-14 DIAGNOSIS — Z8619 Personal history of other infectious and parasitic diseases: Secondary | ICD-10-CM | POA: Insufficient documentation

## 2020-09-14 DIAGNOSIS — F32A Depression, unspecified: Secondary | ICD-10-CM | POA: Diagnosis not present

## 2020-09-14 DIAGNOSIS — Z87891 Personal history of nicotine dependence: Secondary | ICD-10-CM | POA: Diagnosis not present

## 2020-09-14 DIAGNOSIS — C675 Malignant neoplasm of bladder neck: Secondary | ICD-10-CM | POA: Diagnosis not present

## 2020-09-14 DIAGNOSIS — E7800 Pure hypercholesterolemia, unspecified: Secondary | ICD-10-CM | POA: Insufficient documentation

## 2020-09-14 DIAGNOSIS — J302 Other seasonal allergic rhinitis: Secondary | ICD-10-CM | POA: Diagnosis not present

## 2020-09-14 DIAGNOSIS — D696 Thrombocytopenia, unspecified: Secondary | ICD-10-CM | POA: Diagnosis not present

## 2020-09-14 HISTORY — PX: CYSTOSCOPY W/ RETROGRADES: SHX1426

## 2020-09-14 HISTORY — PX: TRANSURETHRAL RESECTION OF BLADDER TUMOR: SHX2575

## 2020-09-14 SURGERY — TURBT (TRANSURETHRAL RESECTION OF BLADDER TUMOR)
Anesthesia: General

## 2020-09-14 MED ORDER — FENTANYL CITRATE (PF) 100 MCG/2ML IJ SOLN: 25.0000 ug | INTRAMUSCULAR | Status: DC | PRN

## 2020-09-14 MED ORDER — FENTANYL CITRATE (PF) 100 MCG/2ML IJ SOLN
INTRAMUSCULAR | Status: AC
  Filled 2020-09-14: qty 2

## 2020-09-14 MED ORDER — PROPOFOL 10 MG/ML IV BOLUS
INTRAVENOUS | Status: DC | PRN
  Administered 2020-09-14: 200 mg via INTRAVENOUS

## 2020-09-14 MED ORDER — DEXAMETHASONE SODIUM PHOSPHATE 10 MG/ML IJ SOLN
INTRAMUSCULAR | Status: DC | PRN
  Administered 2020-09-14: 10 mg via INTRAVENOUS

## 2020-09-14 MED ORDER — IOHEXOL 300 MG/ML  SOLN: INTRAMUSCULAR | Status: DC | PRN

## 2020-09-14 MED ORDER — MIDAZOLAM HCL 2 MG/2ML IJ SOLN
INTRAMUSCULAR | Status: AC
  Filled 2020-09-14: qty 2

## 2020-09-14 MED ORDER — SODIUM CHLORIDE 0.9 % IV SOLN
INTRAVENOUS | Status: DC | PRN
  Administered 2020-09-14: 20 mL

## 2020-09-14 MED ORDER — ONDANSETRON HCL 4 MG/2ML IJ SOLN: 4.0000 mg | Freq: Once | INTRAMUSCULAR | Status: DC | PRN

## 2020-09-14 MED ORDER — ONDANSETRON HCL 4 MG/2ML IJ SOLN
INTRAMUSCULAR | Status: AC
  Filled 2020-09-14: qty 2

## 2020-09-14 MED ORDER — FENTANYL CITRATE (PF) 100 MCG/2ML IJ SOLN
INTRAMUSCULAR | Status: DC | PRN
  Administered 2020-09-14: 50 ug via INTRAVENOUS

## 2020-09-14 MED ORDER — CEFAZOLIN SODIUM-DEXTROSE 2-4 GM/100ML-% IV SOLN
2.0000 g | INTRAVENOUS | Status: AC
  Administered 2020-09-14: 2 g via INTRAVENOUS

## 2020-09-14 MED ORDER — SODIUM CHLORIDE 0.9 % IR SOLN
Status: DC | PRN
  Administered 2020-09-14: 3000 mL

## 2020-09-14 MED ORDER — 0.9 % SODIUM CHLORIDE (POUR BTL) OPTIME
TOPICAL | Status: DC | PRN
  Administered 2020-09-14: 500 mL

## 2020-09-14 MED ORDER — OXYCODONE HCL 5 MG PO TABS: 5.0000 mg | ORAL_TABLET | Freq: Once | ORAL | Status: DC | PRN

## 2020-09-14 MED ORDER — ONDANSETRON HCL 4 MG/2ML IJ SOLN
INTRAMUSCULAR | Status: DC | PRN
  Administered 2020-09-14: 4 mg via INTRAVENOUS

## 2020-09-14 MED ORDER — MIDAZOLAM HCL 5 MG/5ML IJ SOLN
INTRAMUSCULAR | Status: DC | PRN
  Administered 2020-09-14: 2 mg via INTRAVENOUS

## 2020-09-14 MED ORDER — LIDOCAINE 2% (20 MG/ML) 5 ML SYRINGE
INTRAMUSCULAR | Status: DC | PRN
  Administered 2020-09-14: 60 mg via INTRAVENOUS

## 2020-09-14 MED ORDER — CEFAZOLIN SODIUM-DEXTROSE 2-4 GM/100ML-% IV SOLN
INTRAVENOUS | Status: AC
  Filled 2020-09-14: qty 100

## 2020-09-14 MED ORDER — DEXAMETHASONE SODIUM PHOSPHATE 10 MG/ML IJ SOLN
INTRAMUSCULAR | Status: AC
  Filled 2020-09-14: qty 1

## 2020-09-14 MED ORDER — PROPOFOL 10 MG/ML IV BOLUS
INTRAVENOUS | Status: AC
  Filled 2020-09-14: qty 20

## 2020-09-14 MED ORDER — OXYCODONE HCL 5 MG/5ML PO SOLN: 5.0000 mg | Freq: Once | ORAL | Status: DC | PRN

## 2020-09-14 MED ORDER — LACTATED RINGERS IV SOLN: INTRAVENOUS | Status: DC

## 2020-09-14 SURGICAL SUPPLY — 36 items
BAG DRAIN URO-CYSTO SKYTR STRL (DRAIN) ×3 IMPLANT
BAG DRN RND TRDRP ANRFLXCHMBR (UROLOGICAL SUPPLIES)
BAG DRN UROCATH (DRAIN) ×2
BAG URINE DRAIN 2000ML AR STRL (UROLOGICAL SUPPLIES) IMPLANT
BAG URINE LEG 500ML (DRAIN) IMPLANT
BULB IRRIG PATHFIND (MISCELLANEOUS) IMPLANT
CATH FOLEY 2WAY SLVR  5CC 20FR (CATHETERS)
CATH FOLEY 2WAY SLVR  5CC 22FR (CATHETERS)
CATH FOLEY 2WAY SLVR 5CC 20FR (CATHETERS) IMPLANT
CATH FOLEY 2WAY SLVR 5CC 22FR (CATHETERS) IMPLANT
CATH URET 5FR 28IN CONE TIP (BALLOONS)
CATH URET 5FR 28IN OPEN ENDED (CATHETERS) IMPLANT
CATH URET 5FR 70CM CONE TIP (BALLOONS) IMPLANT
CLOTH BEACON ORANGE TIMEOUT ST (SAFETY) ×3 IMPLANT
ELECT REM PT RETURN 9FT ADLT (ELECTROSURGICAL) ×3
ELECTRODE REM PT RTRN 9FT ADLT (ELECTROSURGICAL) ×2 IMPLANT
EVACUATOR MICROVAS BLADDER (UROLOGICAL SUPPLIES) IMPLANT
FIBER LASER FLEXIVA 365 (UROLOGICAL SUPPLIES) IMPLANT
GLOVE SURG ENC MOIS LTX SZ7.5 (GLOVE) ×3 IMPLANT
GOWN STRL REUS W/TWL LRG LVL3 (GOWN DISPOSABLE) ×3 IMPLANT
GUIDEWIRE ANG ZIPWIRE 038X150 (WIRE) IMPLANT
GUIDEWIRE STR DUAL SENSOR (WIRE) IMPLANT
HOLDER FOLEY CATH W/STRAP (MISCELLANEOUS) IMPLANT
IV NS IRRIG 3000ML ARTHROMATIC (IV SOLUTION) ×3 IMPLANT
KIT TURNOVER CYSTO (KITS) ×3 IMPLANT
LOOP MONOPOLAR YLW (ELECTROSURGICAL) IMPLANT
MANIFOLD NEPTUNE II (INSTRUMENTS) IMPLANT
NS IRRIG 500ML POUR BTL (IV SOLUTION) ×3 IMPLANT
PACK CYSTO (CUSTOM PROCEDURE TRAY) ×3 IMPLANT
PLUG CATH AND CAP STER (CATHETERS) IMPLANT
SYR 20ML LL LF (SYRINGE) ×3 IMPLANT
SYR TOOMEY IRRIG 70ML (MISCELLANEOUS)
SYRINGE TOOMEY IRRIG 70ML (MISCELLANEOUS) IMPLANT
TRACTIP FLEXIVA PULS ID 200XHI (Laser) IMPLANT
TRACTIP FLEXIVA PULSE ID 200 (Laser)
TUBE CONNECTING 12X1/4 (SUCTIONS) ×3 IMPLANT

## 2020-09-14 NOTE — Interval H&P Note (Signed)
History and Physical Interval Note:  09/14/2020 9:50 AM  Tyler Calderon  has presented today for surgery, with the diagnosis of BLADDER CANCER.  The various methods of treatment have been discussed with the patient and family. After consideration of risks, benefits and other options for treatment, the patient has consented to  Procedure(s) with comments: TRANSURETHRAL RESECTION OF BLADDER TUMOR (TURBT) (N/A) - 30 MINS CYSTOSCOPY WITH RETROGRADE PYELOGRAM (Bilateral) as a surgical intervention.  The patient's history has been reviewed, patient examined, no change in status, stable for surgery.  I have reviewed the patient's chart and labs.  Questions were answered to the patient's satisfaction.     [name]

## 2020-09-14 NOTE — Anesthesia Postprocedure Evaluation (Signed)
Anesthesia Post Note  Patient: Tyler Calderon  Procedure(s) Performed: TRANSURETHRAL RESECTION OF BLADDER TUMOR (TURBT) CYSTOSCOPY WITH RETROGRADE PYELOGRAM (Bilateral)     Patient location during evaluation: PACU Anesthesia Type: General Level of consciousness: awake and alert Pain management: pain level controlled Vital Signs Assessment: post-procedure vital signs reviewed and stable Respiratory status: spontaneous breathing, nonlabored ventilation and respiratory function stable Cardiovascular status: stable and blood pressure returned to baseline Anesthetic complications: no   No notable events documented.  Last Vitals:  Vitals:   09/14/20 1245 09/14/20 1330  BP: (!) 137/93 129/82  Pulse: 72 69  Resp: 13 15  Temp: (!) 36.3 C 36.4 C  SpO2: 94% 97%    Last Pain:  Vitals:   09/14/20 1330  TempSrc:   PainSc: 0-No pain                 Audry Pili

## 2020-09-14 NOTE — Op Note (Signed)
Preoperative diagnosis:  1.  Recurrent bladder cancer  Postoperative diagnosis: 1.  Same  Procedure(s): 1.  Cystoscopy, bilateral retrograde pyelogram with intraoperative interpretation, transurethral section of bladder tumor (medium)  Surgeon: Dr. Harold Barban  Anesthesia: General  Complications: None  EBL: Minimal  Specimens: Bladder tumor  Disposition of specimens: To pathology  Intraoperative findings: 2 cm exophytic papillary tumor at the 12 o'clock position at the bladder neck junction completely resected.  Bilateral retrogrades were normal  Indication: 70 year old [place] male with history of superficial transitional cell carcinoma the bladder.  Was found to have recurrent papillary tumor at the the bladder neck on surveillance cystoscopy.  Presents at this time to go cystoscopy TURBT and retrograde pyelogram.  Description of procedure:  After obtaining informed consent for the patient was taken major cystoscopy suite placed under general anesthesia.  Placed in the dorsolithotomy position genitalia prepped draped in usual sterile fashion.  Cystoscopy is carried out [address].  Patient was found to have mild bilobar prostatic hypertrophy approximate 3 cm prostatic urethral length.  There was a papillary tumor emanating from the 12 o'clock position at the bladder neck junction.  Bladder was inspected with 30 and 70 degree lenses revealed no other recurrent lesions.  There was some previous scarring from prior TURBT's.  5 French open tip catheter was utilized to cannulate the ureteral orifice ease and retrograde pyelograms were performed revealing normal filling of both upper tracts without evidence of filling defect or hydronephrosis.  Both upper tract emptied out promptly upon removal of the retrograde catheter.  Attention then directed towards bladder tumor resection.  Cystoscope was removed the 26 French continuous-flow bipolar resectoscope was placed utilizing the direct  visualize obturator without difficulty.  Utilizing the loop the bladder lesion was completely removed at the bladder neck.  The base was cauterized no visible lesion remaining.  The tissue was collected for pathologic evaluation.  Bladder was emptied good hemostasis again noted.  The scope was removed procedure terminated.  He was awakened from anesthesia and taken back to the recovery in stable condition.  No immediate complication from the procedure.

## 2020-09-14 NOTE — Transfer of Care (Signed)
Immediate Anesthesia Transfer of Care Note  Patient: KENANIAH BUSSARD  Procedure(s) Performed: TRANSURETHRAL RESECTION OF BLADDER TUMOR (TURBT) CYSTOSCOPY WITH RETROGRADE PYELOGRAM (Bilateral)  Patient Location: PACU  Anesthesia Type:General  Level of Consciousness: awake, alert  and oriented  Airway & Oxygen Therapy: Patient Spontanous Breathing and Patient connected to nasal cannula oxygen  Post-op Assessment: Report given to RN  Post vital signs: Reviewed and stable  Last Vitals:  Vitals Value Taken Time  BP 140/103   Temp    Pulse 82 09/14/20 1226  Resp 12 09/14/20 1226  SpO2 96 % 09/14/20 1226  Vitals shown include unvalidated device data.  Last Pain:  Vitals:   09/14/20 0933  TempSrc: Oral  PainSc: 0-No pain         Complications: No notable events documented.

## 2020-09-14 NOTE — Anesthesia Procedure Notes (Signed)
Procedure Name: LMA Insertion Date/Time: 09/14/2020 11:50 AM Performed by: Bonney Aid, CRNA Pre-anesthesia Checklist: Patient identified, Emergency Drugs available, Suction available and Patient being monitored Patient Re-evaluated:Patient Re-evaluated prior to induction Oxygen Delivery Method: Circle system utilized Preoxygenation: Pre-oxygenation with 100% oxygen Induction Type: IV induction Ventilation: Mask ventilation without difficulty LMA: LMA inserted LMA Size: 5.0 Number of attempts: 1 Airway Equipment and Method: Bite block Placement Confirmation: positive ETCO2 Tube secured with: Tape Dental Injury: Teeth and Oropharynx as per pre-operative assessment

## 2020-09-14 NOTE — Discharge Instructions (Signed)

## 2020-09-14 NOTE — Anesthesia Preprocedure Evaluation (Addendum)
Anesthesia Evaluation  Patient identified by MRN, date of birth, ID band Patient awake    Reviewed: Allergy & Precautions, NPO status , Patient's Chart, lab work & pertinent test results  History of Anesthesia Complications Negative for: history of anesthetic complications  Airway Mallampati: III  TM Distance: >3 FB Neck ROM: Full    Dental  (+) Dental Advisory Given   Pulmonary former smoker,    Pulmonary exam normal        Cardiovascular negative cardio ROS Normal cardiovascular exam     Neuro/Psych PSYCHIATRIC DISORDERS Depression  Neuromuscular disease (numbness b/l toes)    GI/Hepatic negative GI ROS, (+)     substance abuse  alcohol use,   Endo/Other  negative endocrine ROS  Renal/GU negative Renal ROS    Bladder cancer     Musculoskeletal negative musculoskeletal ROS (+)   Abdominal   Peds  Hematology  monoclonal b cell lymphocytosis    Anesthesia Other Findings   Reproductive/Obstetrics                            Anesthesia Physical Anesthesia Plan  ASA: 3  Anesthesia Plan: General   Post-op Pain Management:    Induction: Intravenous  PONV Risk Score and Plan: 2 and Treatment may vary due to age or medical condition, Ondansetron and Dexamethasone  Airway Management Planned: LMA  Additional Equipment: None  Intra-op Plan:   Post-operative Plan: Extubation in OR  Informed Consent: I have reviewed the patients History and Physical, chart, labs and discussed the procedure including the risks, benefits and alternatives for the proposed anesthesia with the patient or authorized representative who has indicated his/her understanding and acceptance.     Dental advisory given  Plan Discussed with: CRNA and Anesthesiologist  Anesthesia Plan Comments:        Anesthesia Quick Evaluation

## 2020-09-15 ENCOUNTER — Encounter (HOSPITAL_BASED_OUTPATIENT_CLINIC_OR_DEPARTMENT_OTHER): Payer: Self-pay | Admitting: Urology

## 2020-09-15 LAB — SURGICAL PATHOLOGY

## 2020-09-20 DIAGNOSIS — C672 Malignant neoplasm of lateral wall of bladder: Secondary | ICD-10-CM | POA: Diagnosis not present

## 2020-09-23 ENCOUNTER — Other Ambulatory Visit: Payer: Self-pay | Admitting: Allergy and Immunology

## 2020-09-28 ENCOUNTER — Encounter: Payer: Self-pay | Admitting: Allergy and Immunology

## 2020-09-28 ENCOUNTER — Ambulatory Visit (INDEPENDENT_AMBULATORY_CARE_PROVIDER_SITE_OTHER): Payer: Medicare Other | Admitting: Allergy and Immunology

## 2020-09-28 ENCOUNTER — Other Ambulatory Visit: Payer: Self-pay

## 2020-09-28 VITALS — BP 124/78 | HR 93 | Temp 97.7°F | Resp 18 | Ht 74.0 in | Wt 187.4 lb

## 2020-09-28 DIAGNOSIS — J3089 Other allergic rhinitis: Secondary | ICD-10-CM

## 2020-09-28 DIAGNOSIS — D72820 Lymphocytosis (symptomatic): Secondary | ICD-10-CM

## 2020-09-28 MED ORDER — MONTELUKAST SODIUM 10 MG PO TABS: 10.0000 mg | ORAL_TABLET | Freq: Every day | ORAL | 1 refills | Status: AC

## 2020-09-28 NOTE — Progress Notes (Signed)
[place]   Follow-up Note  Referring Provider: Cari Caraway, MD Primary Provider: Cari Caraway, MD Date of Office Visit: 09/28/2020  Subjective:   Tyler Calderon (DOB: [birth year]) is a 70 y.o. male who returns to the [facility] on 09/28/2020 in re-evaluation of the following:  HPI: Tyler Calderon returns to this clinic in evaluation of allergic rhinitis and a history of B-cell lymphocytosis.  His last visit with me in this clinic was 01 October 2018 and he did visit with our nurse practitioner on 05 October 2019.  Overall he has done very well while consistently using montelukast and a nasal steroid to treat his upper airway issue.  He has had very little problems with his upper airway and it does not sound as though he has required a systemic steroid or an antibiotic for any type of airway issue.  He is very satisfied with the response he has received for this issue on his current therapy.  Concerning his B-cell lymphocytosis, he is followed by hematology and apparently has a stable B-cell count.  He has received 4 COVID vaccinations.  Allergies as of 09/28/2020   No Known Allergies      Medication List    Benefiber Powd Take by mouth daily.   cetirizine 10 MG tablet Commonly known as: ZYRTEC Take 10 mg by mouth at bedtime.   HYDROcodone-acetaminophen 5-325 MG tablet Commonly known as: Norco Take 1 tablet by mouth every 4 (four) hours as needed for moderate pain.   LORazepam 0.5 MG tablet Commonly known as: ATIVAN 1-2 TABS BEFORE BEDTIME AS NEEDED ORALLY 30 DAYS   montelukast 10 MG tablet Commonly known as: SINGULAIR TAKE 1 TABLET BY MOUTH EVERYDAY AT BEDTIME   Multivitamin Adult Chew Chew 1 tablet by mouth.   Nasacort Allergy 24HR 55 MCG/ACT Aero nasal inhaler Generic drug: triamcinolone every other day. Use one spray in each nostril once daily as directed.   simvastatin 20 MG tablet Commonly  known as: ZOCOR Take 20 mg by mouth at bedtime.   tamsulosin 0.4 MG Caps capsule Commonly known as: FLOMAX at bedtime.   traMADol 50 MG tablet Commonly known as: Ultram Take 1 tablet (50 mg total) by mouth every 6 (six) hours as needed.    Past Medical History:  Diagnosis Date   Bladder cancer [facility]) 05/2020   urologist-- dr Milford Cage---  s/p TURBT   Constipation    Depression    History of immunotherapy    sept to oct 2021   Lymphocytosis asymptomatic   oncologist--- dr Alvy Bimler--- monoclonal b cell lymphocytosis   Numbness of toes    bilateral second and thirds toes  have numb areas   Seasonal allergies    Wears glasses     Past Surgical History:  Procedure Laterality Date   CYSTOSCOPY W/ RETROGRADES Bilateral 05/18/2020   Procedure: CYSTOSCOPY WITH RETROGRADE PYELOGRAM;  Surgeon: [name], MD;  Location: [facility];  Service: Urology;  Laterality: Bilateral;   CYSTOSCOPY W/ RETROGRADES Bilateral 09/14/2020   Procedure: CYSTOSCOPY WITH RETROGRADE PYELOGRAM;  Surgeon: [name], MD;  Location: [facility];  Service: Urology;  Laterality: Bilateral;   LAMINECTOMY  1980s   Two surgeries   SHOULDER ARTHROSCOPY WITH LABRAL REPAIR Right age 34   TRANSURETHRAL RESECTION OF BLADDER TUMOR N/A 05/18/2020   Procedure: BLADDER BIOPSY OF BLADDER TUMOR WITH FULGARATION;  Surgeon: [name], MD;  Location: [facility];  Service: Urology;  Laterality: N/A;  [place] TUMOR N/A 09/14/2020   Procedure: TRANSURETHRAL RESECTION OF BLADDER TUMOR (TURBT);  Surgeon: [name], MD;  Location: [facility];  Service: Urology;  Laterality: N/A;  [place] TUMOR WITH MITOMYCIN-C N/A 08/22/2019   Procedure: TRANSURETHRAL RESECTION OF BLADDER TUMOR WITH GENCITABINE;  Surgeon: Lucas Mallow, MD;  Location: [facility];  Service:  Urology;  Laterality: N/A;    Review of systems negative except as noted in HPI / PMHx or noted below:  Review of Systems  Constitutional: Negative.   HENT: Negative.    Eyes: Negative.   Respiratory: Negative.    Cardiovascular: Negative.   Gastrointestinal: Negative.   Genitourinary: Negative.   Musculoskeletal: Negative.   Skin: Negative.   Neurological: Negative.   Endo/Heme/Allergies: Negative.   Psychiatric/Behavioral: Negative.      Objective:   Vitals:   09/28/20 1340  BP: 124/78  Pulse: 93  Resp: 18  Temp: 97.7 F (36.5 C)  SpO2: 95%   Height: 6\' 2"  (188 cm)  Weight: 187 lb 6.4 oz (85 kg)   Physical Exam Constitutional:      Appearance: He is not diaphoretic.  HENT:     Head: Normocephalic.     Right Ear: Tympanic membrane, ear canal and external ear normal.     Left Ear: Tympanic membrane, ear canal and external ear normal.     Nose: Nose normal. No mucosal edema or rhinorrhea.     Mouth/Throat:     Pharynx: Uvula midline. No oropharyngeal exudate.  Eyes:     Conjunctiva/sclera: Conjunctivae normal.  Neck:     Thyroid: No thyromegaly.     Trachea: Trachea normal. No tracheal tenderness or tracheal deviation.  Cardiovascular:     Rate and Rhythm: Normal rate and regular rhythm.     Heart sounds: Normal heart sounds, S1 normal and S2 normal. No murmur heard. Pulmonary:     Effort: No respiratory distress.     Breath sounds: Normal breath sounds. No stridor. No wheezing or rales.  Lymphadenopathy:     Head:     Right side of head: No tonsillar adenopathy.     Left side of head: No tonsillar adenopathy.     Cervical: No cervical adenopathy.  Skin:    Findings: No erythema or rash.     Nails: There is no clubbing.  Neurological:     Mental Status: He is alert.    Diagnostics: none  Assessment and Plan:   1. Other allergic rhinitis   2. Monoclonal B-cell lymphocytosis of undetermined significance     1. Continue Nasacort - one spray each  nostril 3-7 times per week.    2. Continue Montelukast 10 mg tablet - one tablet one time per day  3. If needed:   A. cetirizine 10 mg tablet 1 time per day  B. Nasal saline  C. Mucinex DM 2 tablets twice a day  4. Return to clinic if needed  Tyler Calderon appears to be doing quite well and he has a very good understanding of his disease state and how his medications work and at this point in time I will refill his medications for the next year and he can follow-up with his primary care doctor for any future refills that are required.  We will certainly be available to see him back in this clinic should there be  a problem in the future.  Allena Katz, MD Allergy / Immunology [place]

## 2020-09-28 NOTE — Patient Instructions (Addendum)
  1. Continue Nasacort - one spray each nostril 3-7 times per week.    2. Continue Montelukast 10 mg tablet - one tablet one time per day  3. If needed:   A. cetirizine 10 mg tablet 1 time per day  B. Nasal saline  C. Mucinex DM 2 tablets twice a day  4. Return to clinic if needed

## 2020-09-29 ENCOUNTER — Encounter: Payer: Self-pay | Admitting: Allergy and Immunology

## 2020-10-19 DIAGNOSIS — L918 Other hypertrophic disorders of the skin: Secondary | ICD-10-CM | POA: Diagnosis not present

## 2020-10-19 DIAGNOSIS — L218 Other seborrheic dermatitis: Secondary | ICD-10-CM | POA: Diagnosis not present

## 2020-10-19 DIAGNOSIS — D225 Melanocytic nevi of trunk: Secondary | ICD-10-CM | POA: Diagnosis not present

## 2020-10-19 DIAGNOSIS — D1801 Hemangioma of skin and subcutaneous tissue: Secondary | ICD-10-CM | POA: Diagnosis not present

## 2020-10-19 DIAGNOSIS — D235 Other benign neoplasm of skin of trunk: Secondary | ICD-10-CM | POA: Diagnosis not present

## 2020-10-19 DIAGNOSIS — D485 Neoplasm of uncertain behavior of skin: Secondary | ICD-10-CM | POA: Diagnosis not present

## 2020-10-19 DIAGNOSIS — L821 Other seborrheic keratosis: Secondary | ICD-10-CM | POA: Diagnosis not present

## 2020-10-19 DIAGNOSIS — D2262 Melanocytic nevi of left upper limb, including shoulder: Secondary | ICD-10-CM | POA: Diagnosis not present

## 2020-10-19 DIAGNOSIS — Z85828 Personal history of other malignant neoplasm of skin: Secondary | ICD-10-CM | POA: Diagnosis not present

## 2020-10-19 DIAGNOSIS — L304 Erythema intertrigo: Secondary | ICD-10-CM | POA: Diagnosis not present

## 2020-10-19 DIAGNOSIS — L570 Actinic keratosis: Secondary | ICD-10-CM | POA: Diagnosis not present

## 2020-10-19 DIAGNOSIS — D2261 Melanocytic nevi of right upper limb, including shoulder: Secondary | ICD-10-CM | POA: Diagnosis not present

## 2020-10-29 DIAGNOSIS — Z23 Encounter for immunization: Secondary | ICD-10-CM | POA: Diagnosis not present

## 2020-11-04 DIAGNOSIS — M545 Low back pain, unspecified: Secondary | ICD-10-CM | POA: Diagnosis not present

## 2020-11-15 DIAGNOSIS — Z20822 Contact with and (suspected) exposure to covid-19: Secondary | ICD-10-CM | POA: Diagnosis not present

## 2020-11-26 DIAGNOSIS — Z1389 Encounter for screening for other disorder: Secondary | ICD-10-CM | POA: Diagnosis not present

## 2020-11-26 DIAGNOSIS — Z125 Encounter for screening for malignant neoplasm of prostate: Secondary | ICD-10-CM | POA: Diagnosis not present

## 2020-11-26 DIAGNOSIS — Z0000 Encounter for general adult medical examination without abnormal findings: Secondary | ICD-10-CM | POA: Diagnosis not present

## 2020-11-26 DIAGNOSIS — E7800 Pure hypercholesterolemia, unspecified: Secondary | ICD-10-CM | POA: Diagnosis not present

## 2020-11-29 DIAGNOSIS — M79672 Pain in left foot: Secondary | ICD-10-CM | POA: Diagnosis not present

## 2020-12-20 DIAGNOSIS — C678 Malignant neoplasm of overlapping sites of bladder: Secondary | ICD-10-CM | POA: Diagnosis not present

## 2021-01-07 DIAGNOSIS — M79672 Pain in left foot: Secondary | ICD-10-CM | POA: Diagnosis not present

## 2021-01-07 DIAGNOSIS — M2042 Other hammer toe(s) (acquired), left foot: Secondary | ICD-10-CM | POA: Diagnosis not present

## 2021-01-07 DIAGNOSIS — M79671 Pain in right foot: Secondary | ICD-10-CM | POA: Diagnosis not present

## 2021-01-07 DIAGNOSIS — M7672 Peroneal tendinitis, left leg: Secondary | ICD-10-CM | POA: Diagnosis not present

## 2021-01-15 DIAGNOSIS — M25571 Pain in right ankle and joints of right foot: Secondary | ICD-10-CM | POA: Diagnosis not present

## 2021-01-15 DIAGNOSIS — M25572 Pain in left ankle and joints of left foot: Secondary | ICD-10-CM | POA: Diagnosis not present

## 2021-01-25 DIAGNOSIS — Z20822 Contact with and (suspected) exposure to covid-19: Secondary | ICD-10-CM | POA: Diagnosis not present

## 2021-02-08 ENCOUNTER — Inpatient Hospital Stay (HOSPITAL_BASED_OUTPATIENT_CLINIC_OR_DEPARTMENT_OTHER): Payer: Medicare Other | Admitting: Hematology and Oncology

## 2021-02-08 ENCOUNTER — Inpatient Hospital Stay: Payer: Medicare Other | Attending: Hematology and Oncology

## 2021-02-08 ENCOUNTER — Other Ambulatory Visit: Payer: Self-pay

## 2021-02-08 DIAGNOSIS — D72820 Lymphocytosis (symptomatic): Secondary | ICD-10-CM | POA: Diagnosis not present

## 2021-02-08 DIAGNOSIS — D696 Thrombocytopenia, unspecified: Secondary | ICD-10-CM

## 2021-02-08 DIAGNOSIS — Z79899 Other long term (current) drug therapy: Secondary | ICD-10-CM | POA: Diagnosis not present

## 2021-02-08 DIAGNOSIS — K760 Fatty (change of) liver, not elsewhere classified: Secondary | ICD-10-CM | POA: Insufficient documentation

## 2021-02-08 LAB — CBC WITH DIFFERENTIAL/PLATELET
Abs Immature Granulocytes: 0.01 10*3/uL (ref 0.00–0.07)
Basophils Absolute: 0 10*3/uL (ref 0.0–0.1)
Basophils Relative: 0 %
Eosinophils Absolute: 0.2 10*3/uL (ref 0.0–0.5)
Eosinophils Relative: 2 %
HCT: 43.6 % (ref 39.0–52.0)
Hemoglobin: 15.2 g/dL (ref 13.0–17.0)
Immature Granulocytes: 0 %
Lymphocytes Relative: 57 %
Lymphs Abs: 5.3 10*3/uL — ABNORMAL HIGH (ref 0.7–4.0)
MCH: 31.7 pg (ref 26.0–34.0)
MCHC: 34.9 g/dL (ref 30.0–36.0)
MCV: 90.8 fL (ref 80.0–100.0)
Monocytes Absolute: 0.9 10*3/uL (ref 0.1–1.0)
Monocytes Relative: 10 %
Neutro Abs: 2.8 10*3/uL (ref 1.7–7.7)
Neutrophils Relative %: 31 %
Platelets: 146 10*3/uL — ABNORMAL LOW (ref 150–400)
RBC: 4.8 MIL/uL (ref 4.22–5.81)
RDW: 13.7 % (ref 11.5–15.5)
WBC: 9.3 10*3/uL (ref 4.0–10.5)
nRBC: 0 % (ref 0.0–0.2)

## 2021-02-09 ENCOUNTER — Encounter: Payer: Self-pay | Admitting: Hematology and Oncology

## 2021-02-09 NOTE — Assessment & Plan Note (Signed)
We discussed the natural history of monoclonal B cell lymphocytosis of unknown significance He is not symptomatic Despite mildly elevated total lymphocyte count, he does not fulfill the criteria for CLL yet He is educated to watch out for signs and symptoms of disease such as unexplained lymphadenopathy and unresolved infection. His lymphocyte count is stable and I think once a year visit is adequate

## 2021-02-09 NOTE — Progress Notes (Signed)
[place] OFFICE PROGRESS NOTE  Patient Care Team: Cari Caraway, MD as PCP - General (Family Medicine)  ASSESSMENT & PLAN:  Monoclonal B-cell lymphocytosis of undetermined significance We discussed the natural history of monoclonal B cell lymphocytosis of unknown significance He is not symptomatic Despite mildly elevated total lymphocyte count, he does not fulfill the criteria for CLL yet He is educated to watch out for signs and symptoms of disease such as unexplained lymphadenopathy and unresolved infection. His lymphocyte count is stable and I think once a year visit is adequate  Thrombocytopenia ([place]) His last CT imaging show evidence of hepatic steatosis His thrombocytopenia is likely related to that It is stable and unchanged Observe closely for now  Orders Placed This Encounter  Procedures   CBC with Differential ([facility] Only)    Standing Status:   Future    Standing Expiration Date:   02/09/2022    All questions were answered. The patient knows to call the clinic with any problems, questions or concerns. The total time spent in the appointment was 20 minutes encounter with patients including review of chart and various tests results, discussions about plan of care and coordination of care plan   Heath Lark, MD 02/09/2021 10:32 AM  INTERVAL HISTORY: Please see below for problem oriented charting. he returns for treatment follow-up for monoclonal B-cell population of unknown significance He is doing well No recent infection No new lymphadenopathy The patient denies any recent signs or symptoms of bleeding such as spontaneous epistaxis, hematuria or hematochezia.   REVIEW OF SYSTEMS:   Constitutional: Denies fevers, chills or abnormal weight loss Eyes: Denies blurriness of vision Ears, nose, mouth, throat, and face: Denies mucositis or sore throat Respiratory: Denies cough, dyspnea or wheezes Cardiovascular: Denies palpitation, chest discomfort  or lower extremity swelling Gastrointestinal:  Denies nausea, heartburn or change in bowel habits Skin: Denies abnormal skin rashes Lymphatics: Denies new lymphadenopathy or easy bruising Neurological:Denies numbness, tingling or new weaknesses Behavioral/Psych: Mood is stable, no new changes  All other systems were reviewed with the patient and are negative.  I have reviewed the past medical history, past surgical history, social history and family history with the patient and they are unchanged from previous note.  ALLERGIES:  has No Known Allergies.  MEDICATIONS:  Current Outpatient Medications  Medication Sig Dispense Refill   cetirizine (ZYRTEC) 10 MG tablet Take 10 mg by mouth at bedtime.     HYDROcodone-acetaminophen (NORCO) 5-325 MG tablet Take 1 tablet by mouth every 4 (four) hours as needed for moderate pain. 8 tablet 0   LORazepam (ATIVAN) 0.5 MG tablet 1-2 TABS BEFORE BEDTIME AS NEEDED ORALLY 30 DAYS  0   montelukast (SINGULAIR) 10 MG tablet Take 1 tablet (10 mg total) by mouth at bedtime. 90 tablet 1   Multiple Vitamins-Minerals (MULTIVITAMIN ADULT) CHEW Chew 1 tablet by mouth.     NASACORT ALLERGY 24HR 55 MCG/ACT AERO nasal inhaler every other day. Use one spray in each nostril once daily as directed.     simvastatin (ZOCOR) 20 MG tablet Take 20 mg by mouth at bedtime.      tamsulosin (FLOMAX) 0.4 MG CAPS capsule at bedtime.      Wheat Dextrin (BENEFIBER) POWD Take by mouth daily.     No current facility-administered medications for this visit.    SUMMARY OF ONCOLOGIC HISTORY:  PEARLY APACHITO is here because of elevated lymphocyte count. He is accompanied by his wife, Tomi Bamberger The patient is a retired  Dance movement psychotherapist.  He does not smoke. He has chronic history of sinusitis/allergies and is followed by an allergist Most recently, he complained of shortness of breath on exertion.  He underwent cardiac evaluation including a negative treadmill test  On his blood  work, he was noted to have mild lymphocytosis.  Flow cytometry detected monoclonal B-cell population less than 5000, nonspecific phenotype.  He was found to have abnormal CBC from his blood works in September 2019.  The total WBC is within normal limits.  The total absolute lymphocyte count is less than 5000 He denies recent infection. The last prescription antibiotics was more than 3 months ago There is not reported symptoms of urinary frequency/urgency or dysuria, diarrhea, joint swelling/pain or abnormal skin rash.  He had no prior history or diagnosis of cancer. His age appropriate screening programs are up-to-date. The patient has no prior diagnosis of autoimmune disease and was not prescribed corticosteroids related products. Last year, he was diagnosed with bladder cancer status post TURBT and instillation of chemotherapy Staging CT imaging from 2021 did not show any evidence of lymphadenopathy   PHYSICAL EXAMINATION: ECOG PERFORMANCE STATUS: 0 - Asymptomatic  Vitals:   02/08/21 1151  BP: 118/82  Pulse: 65  Resp: 16  Temp: 97.6 F (36.4 C)  SpO2: 99%   Filed Weights   02/08/21 1151  Weight: 190 lb 3.2 oz (86.3 kg)    GENERAL:alert, no distress and comfortable SKIN: skin color, texture, turgor are normal, no rashes or significant lesions EYES: normal, Conjunctiva are pink and non-injected, sclera clear OROPHARYNX:no exudate, no erythema and lips, buccal mucosa, and tongue normal  NECK: supple, thyroid normal size, non-tender, without nodularity LYMPH:  no palpable lymphadenopathy in the cervical, axillary or inguinal LUNGS: clear to auscultation and percussion with normal breathing effort HEART: regular rate & rhythm and no murmurs and no lower extremity edema ABDOMEN:abdomen soft, non-tender and normal bowel sounds Musculoskeletal:no cyanosis of digits and no clubbing  NEURO: alert & oriented x 3 with fluent speech, no focal motor/sensory deficits  LABORATORY DATA:  I  have reviewed the data as listed No results found for: NA, K, CL, CO2, GLUCOSE, BUN, CREATININE, CALCIUM, PROT, ALBUMIN, AST, ALT, ALKPHOS, BILITOT, GFRNONAA, GFRAA  No results found for: SPEP, UPEP  Lab Results  Component Value Date   WBC 9.3 02/08/2021   NEUTROABS 2.8 02/08/2021   HGB 15.2 02/08/2021   HCT 43.6 02/08/2021   MCV 90.8 02/08/2021   PLT 146 (L) 02/08/2021      Chemistry   No results found for: NA, K, CL, CO2, BUN, CREATININE, GLU No results found for: CALCIUM, ALKPHOS, AST, ALT, BILITOT

## 2021-02-09 NOTE — Assessment & Plan Note (Signed)
His last CT imaging show evidence of hepatic steatosis His thrombocytopenia is likely related to that It is stable and unchanged Observe closely for now 

## 2021-02-10 DIAGNOSIS — Z20822 Contact with and (suspected) exposure to covid-19: Secondary | ICD-10-CM | POA: Diagnosis not present

## 2021-02-27 DIAGNOSIS — Z20822 Contact with and (suspected) exposure to covid-19: Secondary | ICD-10-CM | POA: Diagnosis not present

## 2021-03-04 DIAGNOSIS — Z20822 Contact with and (suspected) exposure to covid-19: Secondary | ICD-10-CM | POA: Diagnosis not present

## 2021-03-24 DIAGNOSIS — C678 Malignant neoplasm of overlapping sites of bladder: Secondary | ICD-10-CM | POA: Diagnosis not present

## 2021-04-08 DIAGNOSIS — Z20822 Contact with and (suspected) exposure to covid-19: Secondary | ICD-10-CM | POA: Diagnosis not present

## 2021-04-12 DIAGNOSIS — Z20822 Contact with and (suspected) exposure to covid-19: Secondary | ICD-10-CM | POA: Diagnosis not present

## 2021-04-20 DIAGNOSIS — Z20822 Contact with and (suspected) exposure to covid-19: Secondary | ICD-10-CM | POA: Diagnosis not present

## 2021-04-29 DIAGNOSIS — Z20822 Contact with and (suspected) exposure to covid-19: Secondary | ICD-10-CM | POA: Diagnosis not present

## 2021-05-03 DIAGNOSIS — Z20822 Contact with and (suspected) exposure to covid-19: Secondary | ICD-10-CM | POA: Diagnosis not present

## 2021-05-05 DIAGNOSIS — Z20822 Contact with and (suspected) exposure to covid-19: Secondary | ICD-10-CM | POA: Diagnosis not present

## 2021-05-16 DIAGNOSIS — Z20822 Contact with and (suspected) exposure to covid-19: Secondary | ICD-10-CM | POA: Diagnosis not present

## 2021-05-27 DIAGNOSIS — M109 Gout, unspecified: Secondary | ICD-10-CM | POA: Diagnosis not present

## 2021-05-27 DIAGNOSIS — M25472 Effusion, left ankle: Secondary | ICD-10-CM | POA: Diagnosis not present

## 2021-06-02 DIAGNOSIS — M109 Gout, unspecified: Secondary | ICD-10-CM | POA: Diagnosis not present

## 2021-06-02 DIAGNOSIS — M25472 Effusion, left ankle: Secondary | ICD-10-CM | POA: Diagnosis not present

## 2021-06-08 DIAGNOSIS — G629 Polyneuropathy, unspecified: Secondary | ICD-10-CM | POA: Diagnosis not present

## 2021-06-08 DIAGNOSIS — M25472 Effusion, left ankle: Secondary | ICD-10-CM | POA: Diagnosis not present

## 2021-06-08 DIAGNOSIS — M25572 Pain in left ankle and joints of left foot: Secondary | ICD-10-CM | POA: Diagnosis not present

## 2021-06-10 DIAGNOSIS — M25572 Pain in left ankle and joints of left foot: Secondary | ICD-10-CM | POA: Diagnosis not present

## 2021-06-22 DIAGNOSIS — M25572 Pain in left ankle and joints of left foot: Secondary | ICD-10-CM | POA: Diagnosis not present

## 2021-06-24 DIAGNOSIS — N400 Enlarged prostate without lower urinary tract symptoms: Secondary | ICD-10-CM | POA: Diagnosis not present

## 2021-06-24 DIAGNOSIS — C678 Malignant neoplasm of overlapping sites of bladder: Secondary | ICD-10-CM | POA: Diagnosis not present

## 2021-06-27 ENCOUNTER — Other Ambulatory Visit: Payer: Self-pay | Admitting: Sports Medicine

## 2021-06-27 DIAGNOSIS — M25572 Pain in left ankle and joints of left foot: Secondary | ICD-10-CM

## 2021-07-06 ENCOUNTER — Ambulatory Visit
Admission: RE | Admit: 2021-07-06 | Discharge: 2021-07-06 | Disposition: A | Discharge status: Home / Self Care | Payer: Medicare Other | Source: Ambulatory Visit | Attending: Sports Medicine | Admitting: Sports Medicine

## 2021-07-06 DIAGNOSIS — R600 Localized edema: Secondary | ICD-10-CM | POA: Diagnosis not present

## 2021-07-06 DIAGNOSIS — M7989 Other specified soft tissue disorders: Secondary | ICD-10-CM | POA: Diagnosis not present

## 2021-07-06 DIAGNOSIS — M25572 Pain in left ankle and joints of left foot: Secondary | ICD-10-CM | POA: Diagnosis not present

## 2021-09-04 IMAGING — CT CT ABD-PELV W/ CM
2 of 5 series · 16 of 46 positions shown, 18 images · IV contrast (iopamidol)
Comparison: None.

CLINICAL DATA: Painless gross hematuria.

EXAM:
CT ABDOMEN AND PELVIS WITH CONTRAST
TECHNIQUE: Multidetector CT imaging of the abdomen and pelvis was performed
using the standard protocol following bolus administration of
intravenous contrast.
CONTRAST:  100mL [id number] IOPAMIDOL (OY1DKX-8PP) INJECTION 61%

[Series 2: abd pelvis 5.00 br40 s3 axial · axial · 0.81mm/px · z∈[+1099,+1519]mm · 13 of 96 slices shown, 15 images]
[im 6/96  soft-tissue]
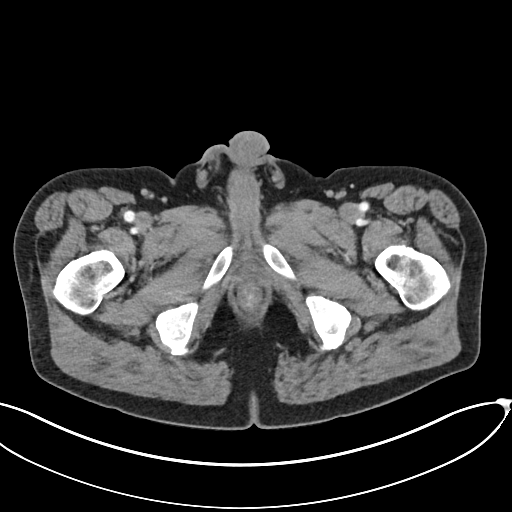
[im 6/96  bone]
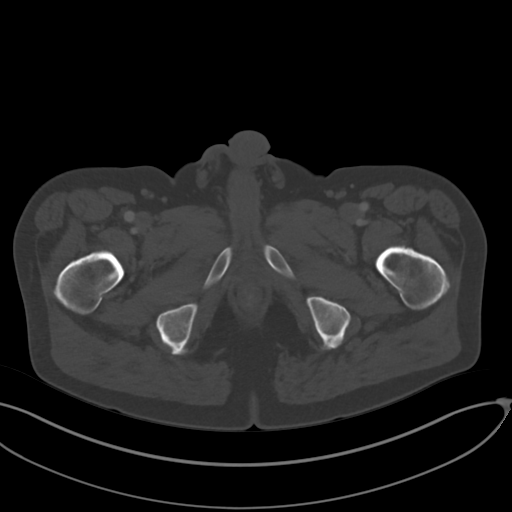
[im 12/96  soft-tissue]
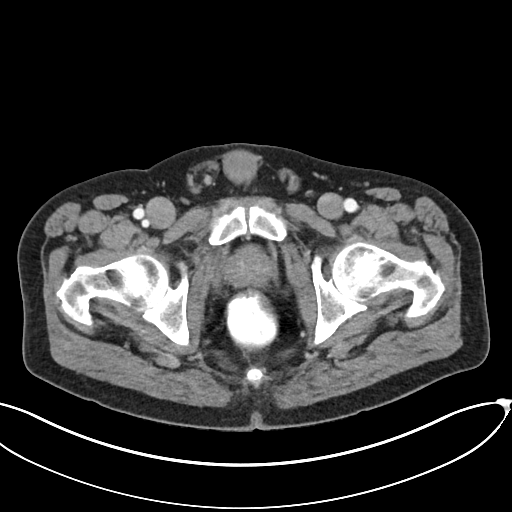
[im 23/96  soft-tissue]
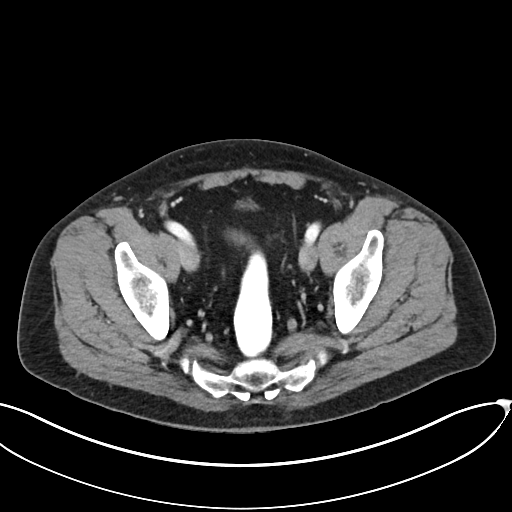
[im 28/96  soft-tissue]
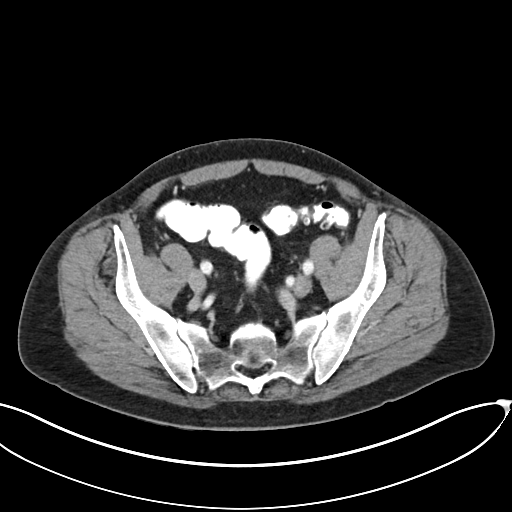
[im 34/96  soft-tissue]
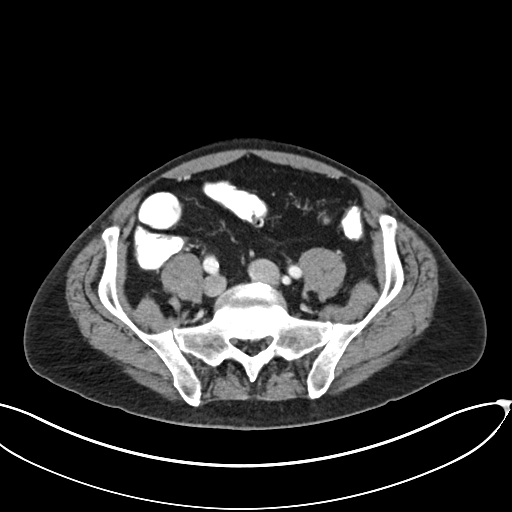
[im 40/96  soft-tissue]
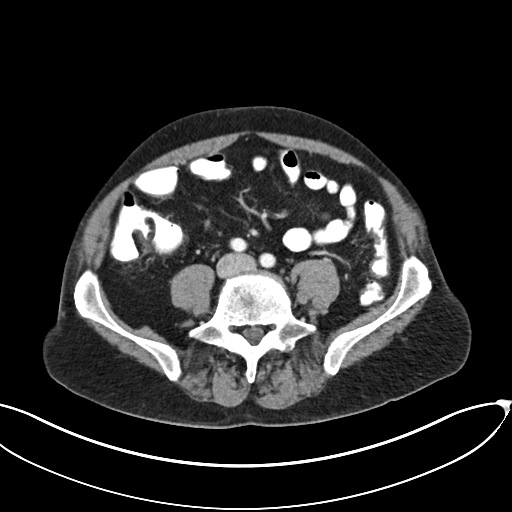
[im 51/96  soft-tissue]
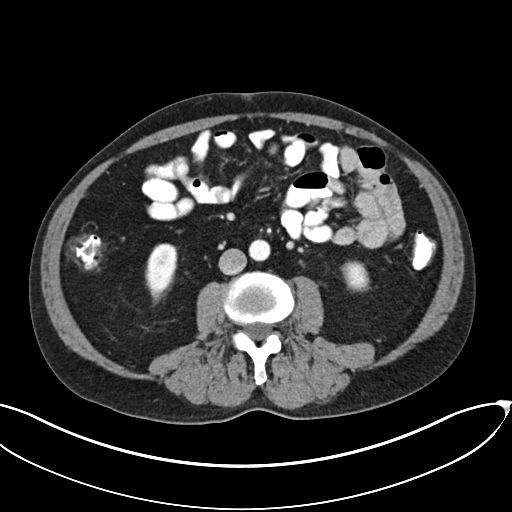
[im 56/96  soft-tissue]
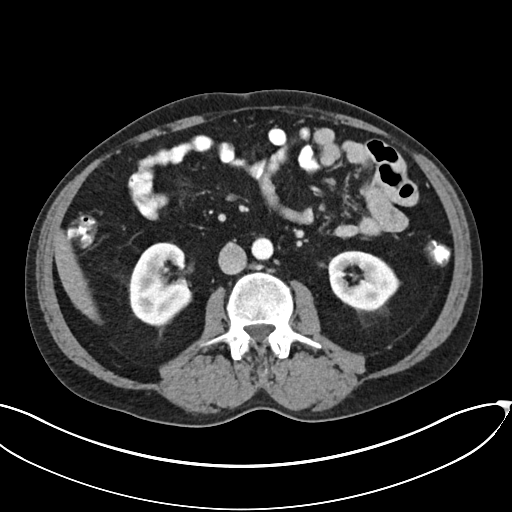
[im 62/96  soft-tissue]
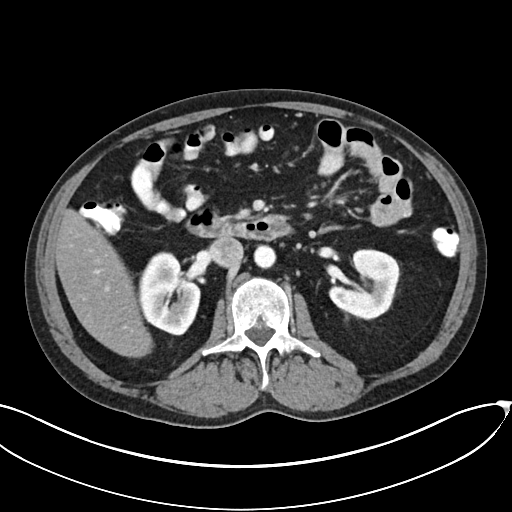
[im 62/96  bone]
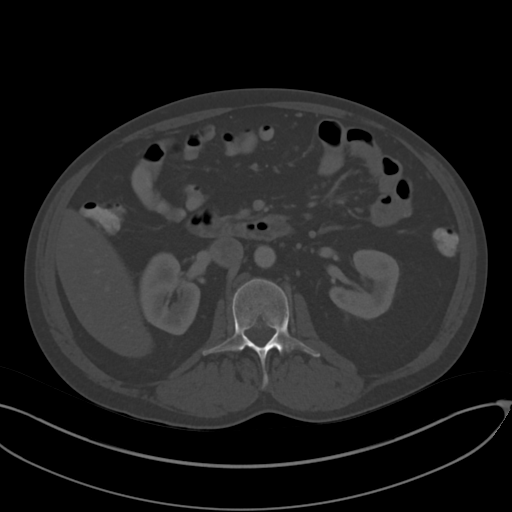
[im 68/96  soft-tissue]
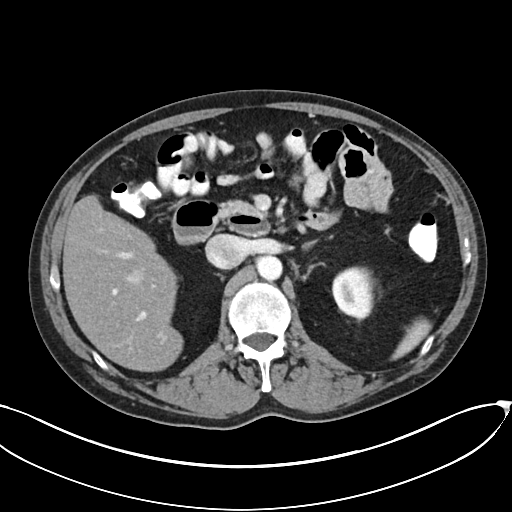
[im 73/96  soft-tissue]
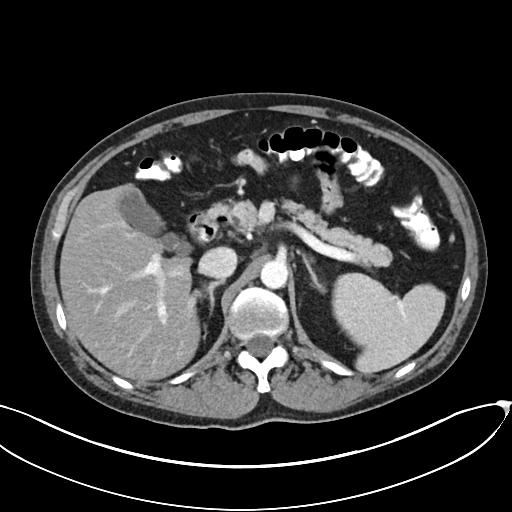
[im 84/96  soft-tissue]
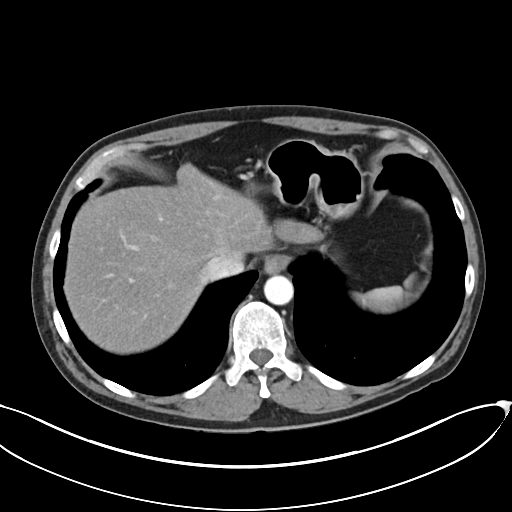
[im 90/96  soft-tissue]
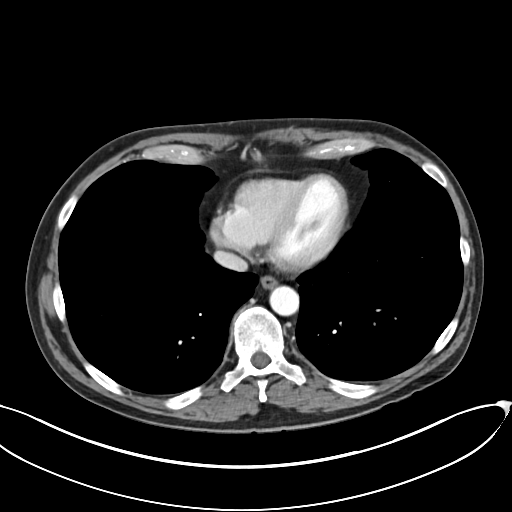

[Series 6: abd pelvis 2.00 br40 s3 cor · coronal · 0.81mm/px · 3 of 205 slices shown]
[im 69/205  soft-tissue]
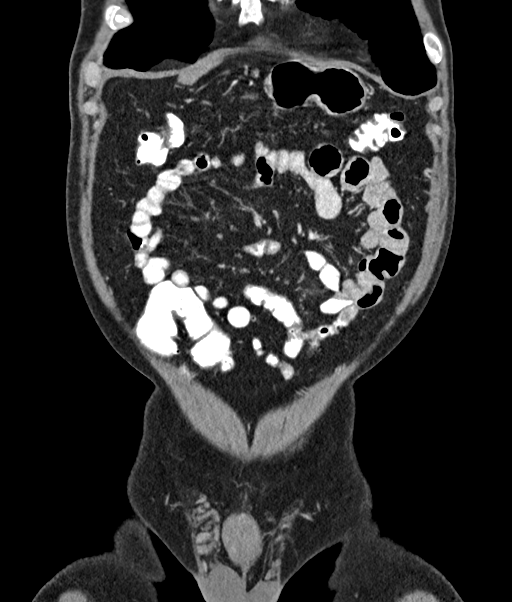
[im 91/205  soft-tissue]
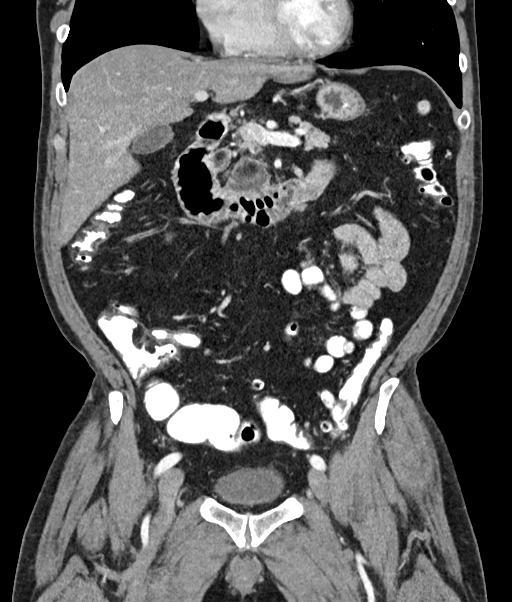
[im 114/205  soft-tissue]
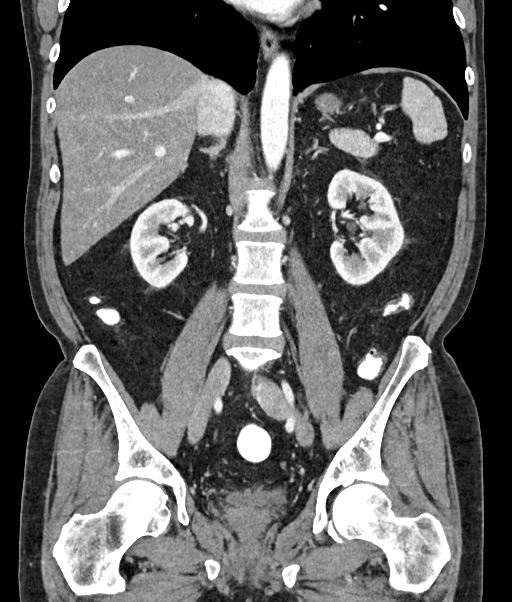

[16 of 46 positions shown; findings below may reference images not displayed]

FINDINGS: Lower Chest: No acute findings.

Hepatobiliary: No hepatic masses identified. Mild-to-moderate
diffuse hepatic steatosis is noted. Gallbladder is unremarkable. No
evidence of biliary ductal dilatation.

Pancreas:  No mass or inflammatory changes.

Spleen: Within normal limits in size and appearance.

Adrenals/Urinary Tract: Normal adrenal glands. No evidence of renal
masses or hydronephrosis. A 1.4 cm polypoid intraluminal mass is
seen along the right posterior bladder wall on image 79/2, highly
suspicious for bladder carcinoma.

Stomach/Bowel: No evidence of obstruction, inflammatory process or
abnormal fluid collections. Normal appendix visualized. Mild
diverticulosis is seen involving the sigmoid colon, however there is
no evidence of diverticulitis.

Vascular/Lymphatic: No pathologically enlarged lymph nodes. No
abdominal aortic aneurysm.

Reproductive:  No mass or other significant abnormality.

Other:  None.

Musculoskeletal:  No suspicious bone lesions identified.
IMPRESSION: 1. 1.4 cm polypoid intraluminal mass along right posterior bladder
wall, highly suspicious for bladder carcinoma. Recommend cystoscopy
for further evaluation.
2. No evidence of metastatic disease, or upper urinary tract
neoplasm.
3. Hepatic steatosis.
4. Mild sigmoid diverticulosis. No radiographic evidence of
diverticulitis.

## 2021-09-19 DIAGNOSIS — N400 Enlarged prostate without lower urinary tract symptoms: Secondary | ICD-10-CM | POA: Diagnosis not present

## 2021-09-19 DIAGNOSIS — C678 Malignant neoplasm of overlapping sites of bladder: Secondary | ICD-10-CM | POA: Diagnosis not present

## 2021-11-07 DIAGNOSIS — Z23 Encounter for immunization: Secondary | ICD-10-CM | POA: Diagnosis not present

## 2021-12-05 DIAGNOSIS — Z0000 Encounter for general adult medical examination without abnormal findings: Secondary | ICD-10-CM | POA: Diagnosis not present

## 2021-12-05 DIAGNOSIS — Z6824 Body mass index (BMI) 24.0-24.9, adult: Secondary | ICD-10-CM | POA: Diagnosis not present

## 2021-12-05 DIAGNOSIS — Z1389 Encounter for screening for other disorder: Secondary | ICD-10-CM | POA: Diagnosis not present

## 2021-12-07 DIAGNOSIS — Z6824 Body mass index (BMI) 24.0-24.9, adult: Secondary | ICD-10-CM | POA: Diagnosis not present

## 2021-12-07 DIAGNOSIS — Z125 Encounter for screening for malignant neoplasm of prostate: Secondary | ICD-10-CM | POA: Diagnosis not present

## 2021-12-07 DIAGNOSIS — M109 Gout, unspecified: Secondary | ICD-10-CM | POA: Diagnosis not present

## 2021-12-07 DIAGNOSIS — C679 Malignant neoplasm of bladder, unspecified: Secondary | ICD-10-CM | POA: Diagnosis not present

## 2021-12-07 DIAGNOSIS — J309 Allergic rhinitis, unspecified: Secondary | ICD-10-CM | POA: Diagnosis not present

## 2021-12-07 DIAGNOSIS — D72820 Lymphocytosis (symptomatic): Secondary | ICD-10-CM | POA: Diagnosis not present

## 2021-12-07 DIAGNOSIS — K5909 Other constipation: Secondary | ICD-10-CM | POA: Diagnosis not present

## 2021-12-07 DIAGNOSIS — Z2911 Encounter for prophylactic immunotherapy for respiratory syncytial virus (RSV): Secondary | ICD-10-CM | POA: Diagnosis not present

## 2021-12-07 DIAGNOSIS — N401 Enlarged prostate with lower urinary tract symptoms: Secondary | ICD-10-CM | POA: Diagnosis not present

## 2021-12-07 DIAGNOSIS — E7800 Pure hypercholesterolemia, unspecified: Secondary | ICD-10-CM | POA: Diagnosis not present

## 2021-12-07 DIAGNOSIS — G479 Sleep disorder, unspecified: Secondary | ICD-10-CM | POA: Diagnosis not present

## 2021-12-19 DIAGNOSIS — L821 Other seborrheic keratosis: Secondary | ICD-10-CM | POA: Diagnosis not present

## 2021-12-19 DIAGNOSIS — L720 Epidermal cyst: Secondary | ICD-10-CM | POA: Diagnosis not present

## 2021-12-19 DIAGNOSIS — D225 Melanocytic nevi of trunk: Secondary | ICD-10-CM | POA: Diagnosis not present

## 2021-12-19 DIAGNOSIS — D2272 Melanocytic nevi of left lower limb, including hip: Secondary | ICD-10-CM | POA: Diagnosis not present

## 2021-12-19 DIAGNOSIS — D2261 Melanocytic nevi of right upper limb, including shoulder: Secondary | ICD-10-CM | POA: Diagnosis not present

## 2021-12-19 DIAGNOSIS — Z85828 Personal history of other malignant neoplasm of skin: Secondary | ICD-10-CM | POA: Diagnosis not present

## 2021-12-19 DIAGNOSIS — D2262 Melanocytic nevi of left upper limb, including shoulder: Secondary | ICD-10-CM | POA: Diagnosis not present

## 2021-12-19 DIAGNOSIS — D2271 Melanocytic nevi of right lower limb, including hip: Secondary | ICD-10-CM | POA: Diagnosis not present

## 2021-12-19 DIAGNOSIS — L700 Acne vulgaris: Secondary | ICD-10-CM | POA: Diagnosis not present

## 2021-12-26 DIAGNOSIS — C678 Malignant neoplasm of overlapping sites of bladder: Secondary | ICD-10-CM | POA: Diagnosis not present

## 2021-12-26 DIAGNOSIS — N400 Enlarged prostate without lower urinary tract symptoms: Secondary | ICD-10-CM | POA: Diagnosis not present

## 2022-02-09 ENCOUNTER — Inpatient Hospital Stay (HOSPITAL_BASED_OUTPATIENT_CLINIC_OR_DEPARTMENT_OTHER): Payer: Medicare Other | Admitting: Hematology and Oncology

## 2022-02-09 ENCOUNTER — Inpatient Hospital Stay: Payer: Medicare Other | Attending: Hematology and Oncology

## 2022-02-09 ENCOUNTER — Encounter: Payer: Self-pay | Admitting: Hematology and Oncology

## 2022-02-09 VITALS — BP 147/87 | HR 77 | Temp 98.3°F | Resp 18 | Ht 74.0 in | Wt 197.0 lb

## 2022-02-09 DIAGNOSIS — K760 Fatty (change of) liver, not elsewhere classified: Secondary | ICD-10-CM | POA: Insufficient documentation

## 2022-02-09 DIAGNOSIS — R0602 Shortness of breath: Secondary | ICD-10-CM | POA: Insufficient documentation

## 2022-02-09 DIAGNOSIS — D696 Thrombocytopenia, unspecified: Secondary | ICD-10-CM | POA: Diagnosis not present

## 2022-02-09 DIAGNOSIS — D72820 Lymphocytosis (symptomatic): Secondary | ICD-10-CM | POA: Insufficient documentation

## 2022-02-09 DIAGNOSIS — Z79899 Other long term (current) drug therapy: Secondary | ICD-10-CM | POA: Diagnosis not present

## 2022-02-09 LAB — CBC WITH DIFFERENTIAL (CANCER CENTER ONLY)
Abs Immature Granulocytes: 0.03 10*3/uL (ref 0.00–0.07)
Basophils Absolute: 0 10*3/uL (ref 0.0–0.1)
Basophils Relative: 0 %
Eosinophils Absolute: 0.2 10*3/uL (ref 0.0–0.5)
Eosinophils Relative: 2 %
HCT: 43.4 % (ref 39.0–52.0)
Hemoglobin: 15.2 g/dL (ref 13.0–17.0)
Immature Granulocytes: 0 %
Lymphocytes Relative: 54 %
Lymphs Abs: 5.3 10*3/uL — ABNORMAL HIGH (ref 0.7–4.0)
MCH: 31.4 pg (ref 26.0–34.0)
MCHC: 35 g/dL (ref 30.0–36.0)
MCV: 89.7 fL (ref 80.0–100.0)
Monocytes Absolute: 0.9 10*3/uL (ref 0.1–1.0)
Monocytes Relative: 10 %
Neutro Abs: 3.3 10*3/uL (ref 1.7–7.7)
Neutrophils Relative %: 34 %
Platelet Count: 145 10*3/uL — ABNORMAL LOW (ref 150–400)
RBC: 4.84 MIL/uL (ref 4.22–5.81)
RDW: 13.5 % (ref 11.5–15.5)
WBC Count: 9.8 10*3/uL (ref 4.0–10.5)
nRBC: 0 % (ref 0.0–0.2)

## 2022-02-09 NOTE — Assessment & Plan Note (Signed)
His last CT imaging show evidence of hepatic steatosis His thrombocytopenia is likely related to that It is stable and unchanged Observe closely for now

## 2022-02-09 NOTE — Progress Notes (Signed)
[place] OFFICE PROGRESS NOTE  Patient Care Team: Cari Caraway, MD as PCP - General (Family Medicine)  ASSESSMENT & PLAN:  Monoclonal B-cell lymphocytosis of undetermined significance We discussed the natural history of monoclonal B cell lymphocytosis of unknown significance He is not symptomatic Despite mildly elevated total lymphocyte count, he does not fulfill the criteria for CLL yet The absolute lymphocyte site count is stable He is educated to watch out for signs and symptoms of disease such as unexplained lymphadenopathy and unresolved infection. His lymphocyte count is stable and I think once a year visit is adequate  Thrombocytopenia ([place]) His last CT imaging show evidence of hepatic steatosis His thrombocytopenia is likely related to that It is stable and unchanged Observe closely for now  Orders Placed This Encounter  Procedures   CBC with Differential ([facility] Only)    Standing Status:   Future    Standing Expiration Date:   02/10/2023    All questions were answered. The patient knows to call the clinic with any problems, questions or concerns. The total time spent in the appointment was 20 minutes encounter with patients including review of chart and various tests results, discussions about plan of care and coordination of care plan   Heath Lark, MD 02/09/2022 2:09 PM  INTERVAL HISTORY: Please see below for problem oriented charting. he returns for follow-up for monoclonal B-cell lymphocytosis of unknown significance He is doing well No new lymphadenopathy  REVIEW OF SYSTEMS:   Constitutional: Denies fevers, chills or abnormal weight loss Eyes: Denies blurriness of vision Ears, nose, mouth, throat, and face: Denies mucositis or sore throat Respiratory: Denies cough, dyspnea or wheezes Cardiovascular: Denies palpitation, chest discomfort or lower extremity swelling Gastrointestinal:  Denies nausea, heartburn or change in bowel  habits Skin: Denies abnormal skin rashes Lymphatics: Denies new lymphadenopathy or easy bruising Neurological:Denies numbness, tingling or new weaknesses Behavioral/Psych: Mood is stable, no new changes  All other systems were reviewed with the patient and are negative.  I have reviewed the past medical history, past surgical history, social history and family history with the patient and they are unchanged from previous note.  ALLERGIES:  has No Known Allergies.  MEDICATIONS:  Current Outpatient Medications  Medication Sig Dispense Refill   cetirizine (ZYRTEC) 10 MG tablet Take 10 mg by mouth at bedtime.     LORazepam (ATIVAN) 0.5 MG tablet 1-2 TABS BEFORE BEDTIME AS NEEDED ORALLY 30 DAYS  0   montelukast (SINGULAIR) 10 MG tablet Take 1 tablet (10 mg total) by mouth at bedtime. 90 tablet 1   NASACORT ALLERGY 24HR 55 MCG/ACT AERO nasal inhaler every other day. Use one spray in each nostril once daily as directed.     simvastatin (ZOCOR) 20 MG tablet Take 20 mg by mouth at bedtime.      tamsulosin (FLOMAX) 0.4 MG CAPS capsule at bedtime.      Wheat Dextrin (BENEFIBER) POWD Take by mouth daily.     No current facility-administered medications for this visit.    SUMMARY OF ONCOLOGIC HISTORY:  Tyler Calderon is here because of elevated lymphocyte count. He is accompanied by his wife, Tyler Calderon The patient is a retired Dance movement psychotherapist.  He does not smoke. He has chronic history of sinusitis/allergies and is followed by an allergist Most recently, he complained of shortness of breath on exertion.  He underwent cardiac evaluation including a negative treadmill test  On his blood work, he was noted to have mild lymphocytosis.  Flow  cytometry detected monoclonal B-cell population less than 5000, nonspecific phenotype.  He was found to have abnormal CBC from his blood works in September 2019.  The total WBC is within normal limits.  The total absolute lymphocyte count is less than 5000 He  denies recent infection. The last prescription antibiotics was more than 3 months ago There is not reported symptoms of urinary frequency/urgency or dysuria, diarrhea, joint swelling/pain or abnormal skin rash.  He had no prior history or diagnosis of cancer. His age appropriate screening programs are up-to-date. The patient has no prior diagnosis of autoimmune disease and was not prescribed corticosteroids related products. Last year, he was diagnosed with bladder cancer status post TURBT and instillation of chemotherapy Staging CT imaging from 2021 did not show any evidence of lymphadenopathy  PHYSICAL EXAMINATION: ECOG PERFORMANCE STATUS: 0 - Asymptomatic  Vitals:   02/09/22 1151  BP: (!) 147/87  Pulse: 77  Resp: 18  Temp: 98.3 F (36.8 C)  SpO2: 98%   Filed Weights   02/09/22 1151  Weight: 197 lb (89.4 kg)    GENERAL:alert, no distress and comfortable SKIN: skin color, texture, turgor are normal, no rashes or significant lesions EYES: normal, Conjunctiva are pink and non-injected, sclera clear OROPHARYNX:no exudate, no erythema and lips, buccal mucosa, and tongue normal  NECK: supple, thyroid normal size, non-tender, without nodularity LYMPH:  no palpable lymphadenopathy in the cervical, axillary or inguinal LUNGS: clear to auscultation and percussion with normal breathing effort HEART: regular rate & rhythm and no murmurs and no lower extremity edema ABDOMEN:abdomen soft, non-tender and normal bowel sounds Musculoskeletal:no cyanosis of digits and no clubbing  NEURO: alert & oriented x 3 with fluent speech, no focal motor/sensory deficits  LABORATORY DATA:  I have reviewed the data as listed No results found for: "NA", "K", "CL", "CO2", "GLUCOSE", "BUN", "CREATININE", "CALCIUM", "PROT", "ALBUMIN", "AST", "ALT", "ALKPHOS", "BILITOT", "GFRNONAA", "GFRAA"  No results found for: "SPEP", "UPEP"  Lab Results  Component Value Date   WBC 9.8 02/09/2022   NEUTROABS 3.3  02/09/2022   HGB 15.2 02/09/2022   HCT 43.4 02/09/2022   MCV 89.7 02/09/2022   PLT 145 (L) 02/09/2022      Chemistry   No results found for: "NA", "K", "CL", "CO2", "BUN", "CREATININE", "GLU" No results found for: "CALCIUM", "ALKPHOS", "AST", "ALT", "BILITOT"

## 2022-02-09 NOTE — Assessment & Plan Note (Addendum)
We discussed the natural history of monoclonal B cell lymphocytosis of unknown significance He is not symptomatic Despite mildly elevated total lymphocyte count, he does not fulfill the criteria for CLL yet The absolute lymphocyte site count is stable He is educated to watch out for signs and symptoms of disease such as unexplained lymphadenopathy and unresolved infection. His lymphocyte count is stable and I think once a year visit is adequate

## 2022-02-16 DIAGNOSIS — C678 Malignant neoplasm of overlapping sites of bladder: Secondary | ICD-10-CM | POA: Diagnosis not present

## 2022-02-16 DIAGNOSIS — N50812 Left testicular pain: Secondary | ICD-10-CM | POA: Diagnosis not present

## 2022-02-23 IMAGING — US US ABDOMINAL AORTA SCREENING AAA
1 series · 8 of 8 positions shown · non-contrast
Comparison: None.

CLINICAL DATA: Male between 65-75 years of age with a smoking
history.

EXAM:
US ABDOMINAL AORTA MEDICARE SCREENING
TECHNIQUE: Ultrasound examination of the abdominal aorta was performed as a
screening evaluation for abdominal aortic aneurysm.

[Series 1: us abdominal aorta screening aaa · 0.33mm/px · 8 of 8 slices shown]
[im 1/8]
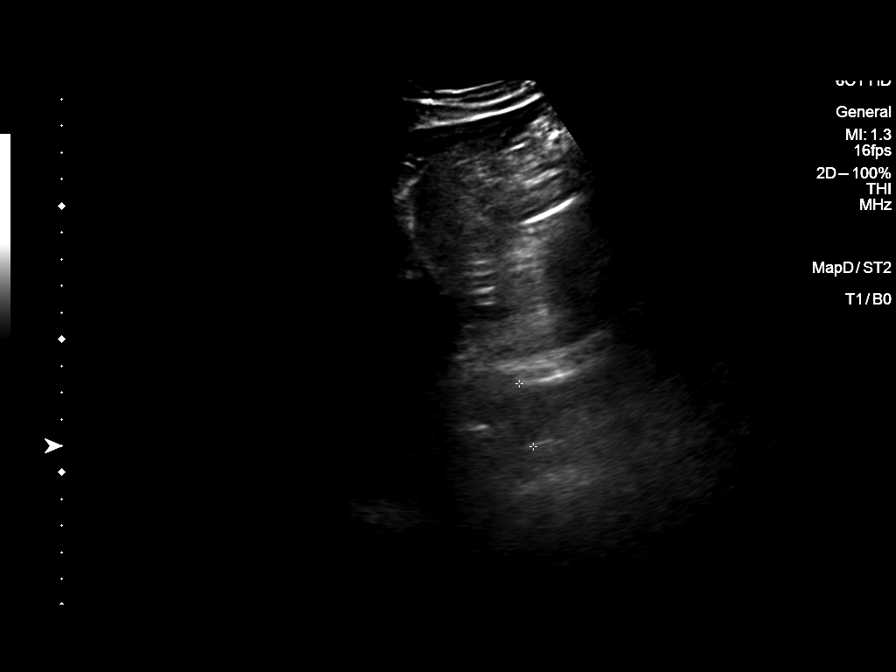
[im 2/8]
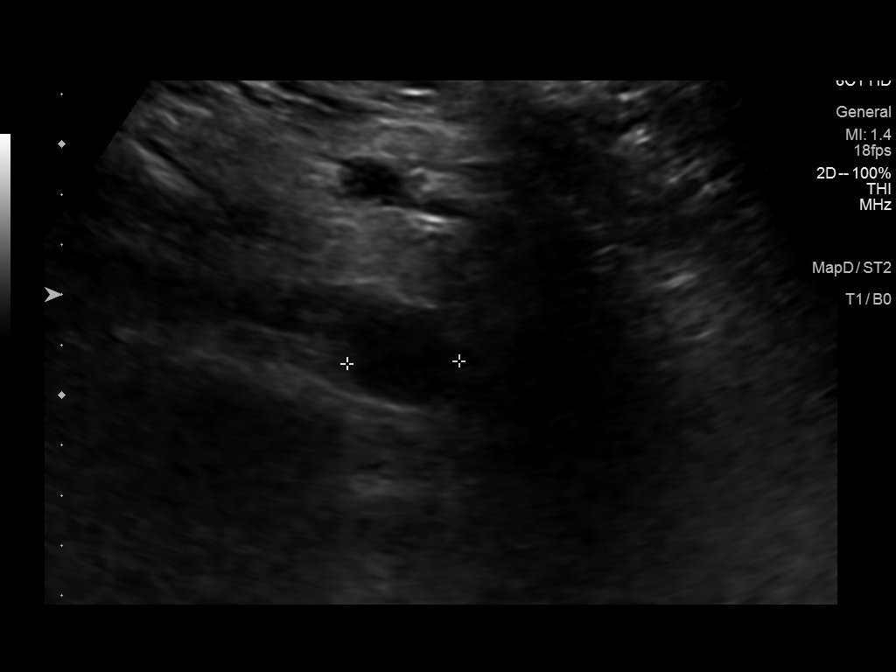
[im 3/8]
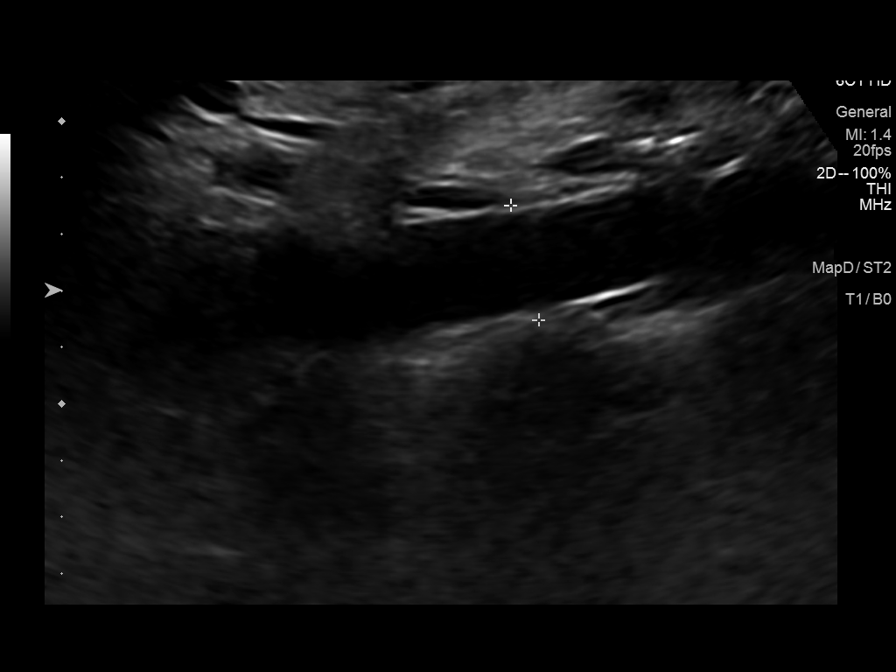
[im 4/8]
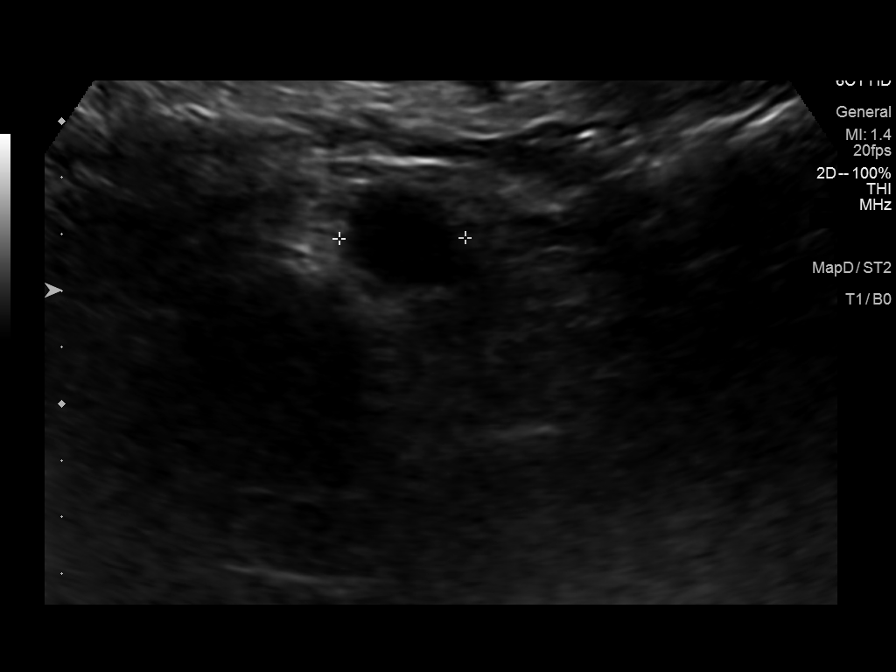
[im 5/8]
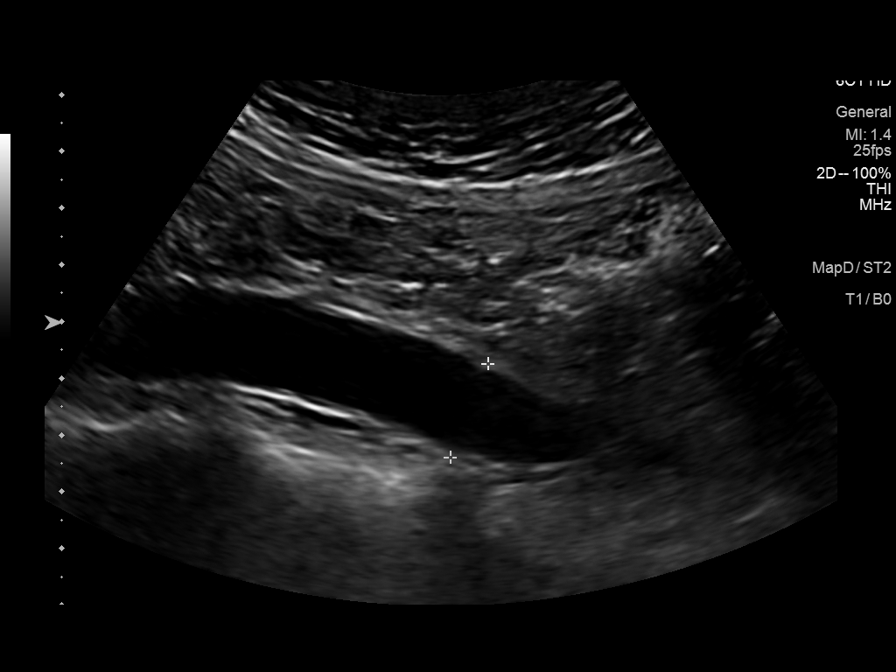
[im 6/8]
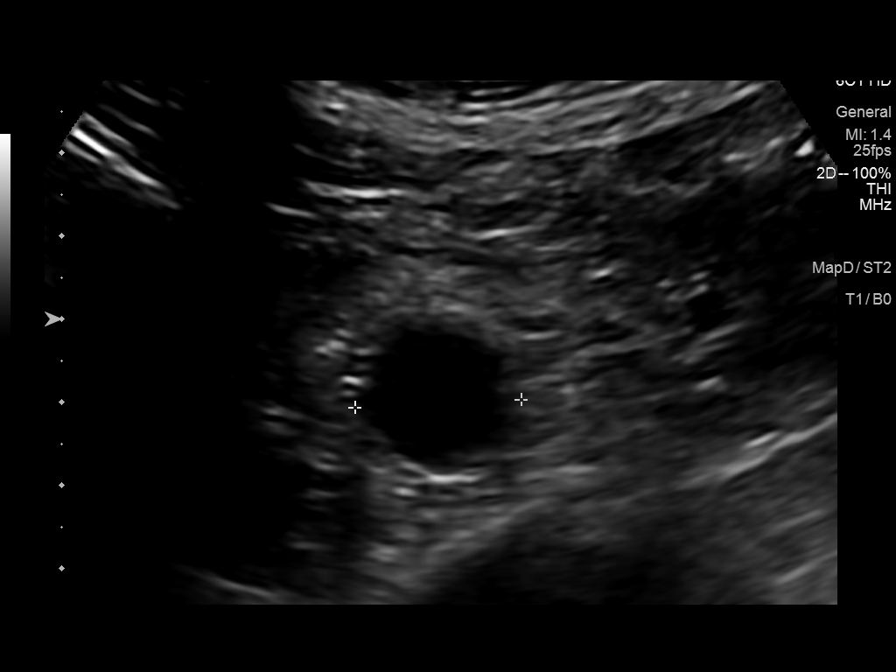
[im 7/8]
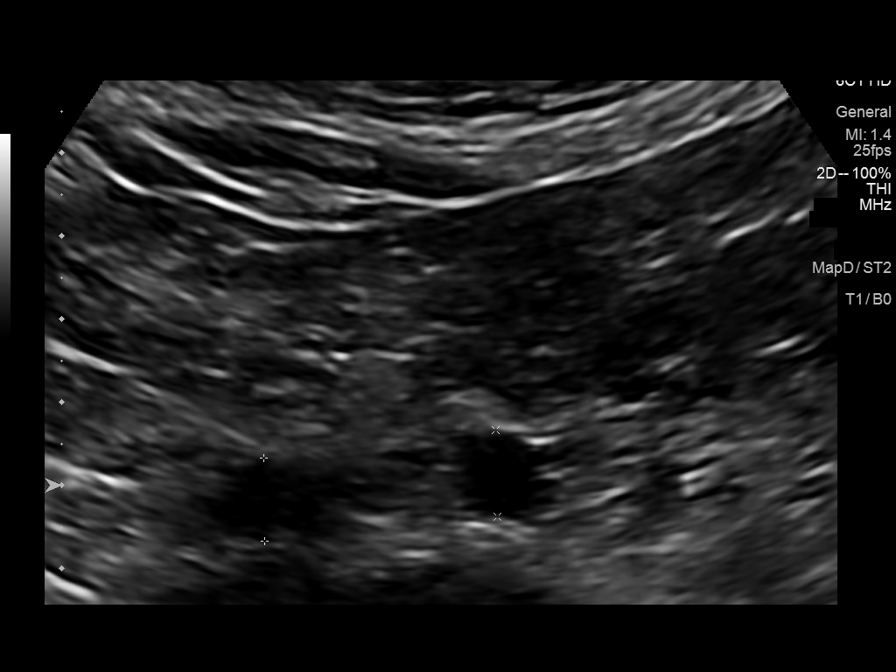
[im 8/8]
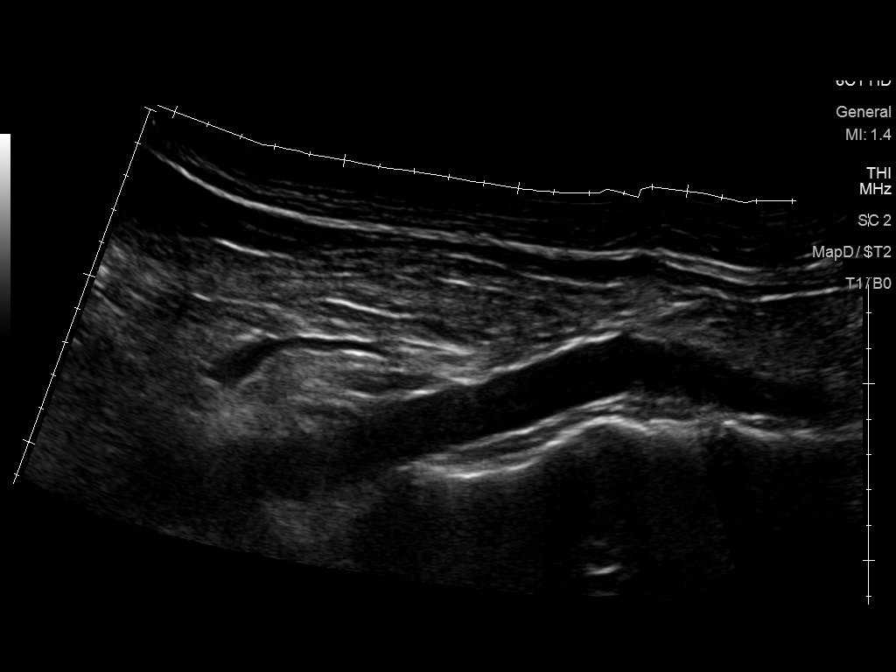

[8 of 8 positions shown; findings below may reference images not displayed]

FINDINGS: Abdominal aortic measurements as follows:

Proximal:  2.4 cm

Mid:  2.1 cm

Distal:  1.8 cm
IMPRESSION: No evidence of abdominal aortic aneurysm.

## 2022-04-06 DIAGNOSIS — C678 Malignant neoplasm of overlapping sites of bladder: Secondary | ICD-10-CM | POA: Diagnosis not present

## 2022-08-24 DIAGNOSIS — N401 Enlarged prostate with lower urinary tract symptoms: Secondary | ICD-10-CM | POA: Diagnosis not present

## 2022-08-24 DIAGNOSIS — R8289 Other abnormal findings on cytological and histological examination of urine: Secondary | ICD-10-CM | POA: Diagnosis not present

## 2022-08-24 DIAGNOSIS — R3912 Poor urinary stream: Secondary | ICD-10-CM | POA: Diagnosis not present

## 2022-08-24 DIAGNOSIS — C678 Malignant neoplasm of overlapping sites of bladder: Secondary | ICD-10-CM | POA: Diagnosis not present

## 2022-10-13 DIAGNOSIS — C678 Malignant neoplasm of overlapping sites of bladder: Secondary | ICD-10-CM | POA: Diagnosis not present

## 2022-10-23 DIAGNOSIS — Z23 Encounter for immunization: Secondary | ICD-10-CM | POA: Diagnosis not present

## 2022-11-03 DIAGNOSIS — K5750 Diverticulosis of both small and large intestine without perforation or abscess without bleeding: Secondary | ICD-10-CM | POA: Diagnosis not present

## 2022-11-03 DIAGNOSIS — K760 Fatty (change of) liver, not elsewhere classified: Secondary | ICD-10-CM | POA: Diagnosis not present

## 2022-11-03 DIAGNOSIS — C679 Malignant neoplasm of bladder, unspecified: Secondary | ICD-10-CM | POA: Diagnosis not present

## 2022-11-03 DIAGNOSIS — C678 Malignant neoplasm of overlapping sites of bladder: Secondary | ICD-10-CM | POA: Diagnosis not present

## 2022-11-03 DIAGNOSIS — N201 Calculus of ureter: Secondary | ICD-10-CM | POA: Diagnosis not present

## 2022-11-15 DIAGNOSIS — H5202 Hypermetropia, left eye: Secondary | ICD-10-CM | POA: Diagnosis not present

## 2022-11-15 DIAGNOSIS — H43813 Vitreous degeneration, bilateral: Secondary | ICD-10-CM | POA: Diagnosis not present

## 2022-11-15 DIAGNOSIS — H52221 Regular astigmatism, right eye: Secondary | ICD-10-CM | POA: Diagnosis not present

## 2022-11-15 DIAGNOSIS — H33303 Unspecified retinal break, bilateral: Secondary | ICD-10-CM | POA: Diagnosis not present

## 2022-11-15 DIAGNOSIS — H43393 Other vitreous opacities, bilateral: Secondary | ICD-10-CM | POA: Diagnosis not present

## 2022-11-15 DIAGNOSIS — H3540 Unspecified peripheral retinal degeneration: Secondary | ICD-10-CM | POA: Diagnosis not present

## 2022-11-15 DIAGNOSIS — H524 Presbyopia: Secondary | ICD-10-CM | POA: Diagnosis not present

## 2022-11-15 DIAGNOSIS — H5211 Myopia, right eye: Secondary | ICD-10-CM | POA: Diagnosis not present

## 2022-11-15 DIAGNOSIS — H25813 Combined forms of age-related cataract, bilateral: Secondary | ICD-10-CM | POA: Diagnosis not present

## 2022-11-20 DIAGNOSIS — H903 Sensorineural hearing loss, bilateral: Secondary | ICD-10-CM | POA: Diagnosis not present

## 2022-11-20 DIAGNOSIS — Z822 Family history of deafness and hearing loss: Secondary | ICD-10-CM | POA: Diagnosis not present

## 2022-11-20 DIAGNOSIS — Z77122 Contact with and (suspected) exposure to noise: Secondary | ICD-10-CM | POA: Diagnosis not present

## 2022-12-11 DIAGNOSIS — N401 Enlarged prostate with lower urinary tract symptoms: Secondary | ICD-10-CM | POA: Diagnosis not present

## 2022-12-11 DIAGNOSIS — R7309 Other abnormal glucose: Secondary | ICD-10-CM | POA: Diagnosis not present

## 2022-12-11 DIAGNOSIS — J309 Allergic rhinitis, unspecified: Secondary | ICD-10-CM | POA: Diagnosis not present

## 2022-12-11 DIAGNOSIS — Z6825 Body mass index (BMI) 25.0-25.9, adult: Secondary | ICD-10-CM | POA: Diagnosis not present

## 2022-12-11 DIAGNOSIS — G479 Sleep disorder, unspecified: Secondary | ICD-10-CM | POA: Diagnosis not present

## 2022-12-11 DIAGNOSIS — H9190 Unspecified hearing loss, unspecified ear: Secondary | ICD-10-CM | POA: Diagnosis not present

## 2022-12-11 DIAGNOSIS — Z860100 Personal history of colon polyps, unspecified: Secondary | ICD-10-CM | POA: Diagnosis not present

## 2022-12-11 DIAGNOSIS — R7989 Other specified abnormal findings of blood chemistry: Secondary | ICD-10-CM | POA: Diagnosis not present

## 2022-12-11 DIAGNOSIS — E7800 Pure hypercholesterolemia, unspecified: Secondary | ICD-10-CM | POA: Diagnosis not present

## 2022-12-11 DIAGNOSIS — Z23 Encounter for immunization: Secondary | ICD-10-CM | POA: Diagnosis not present

## 2022-12-11 DIAGNOSIS — E782 Mixed hyperlipidemia: Secondary | ICD-10-CM | POA: Diagnosis not present

## 2022-12-11 DIAGNOSIS — Z0000 Encounter for general adult medical examination without abnormal findings: Secondary | ICD-10-CM | POA: Diagnosis not present

## 2022-12-22 DIAGNOSIS — N201 Calculus of ureter: Secondary | ICD-10-CM | POA: Diagnosis not present

## 2022-12-22 DIAGNOSIS — K760 Fatty (change of) liver, not elsewhere classified: Secondary | ICD-10-CM | POA: Diagnosis not present

## 2022-12-22 DIAGNOSIS — K5750 Diverticulosis of both small and large intestine without perforation or abscess without bleeding: Secondary | ICD-10-CM | POA: Diagnosis not present

## 2023-01-15 DIAGNOSIS — R7309 Other abnormal glucose: Secondary | ICD-10-CM | POA: Diagnosis not present

## 2023-01-15 DIAGNOSIS — E7800 Pure hypercholesterolemia, unspecified: Secondary | ICD-10-CM | POA: Diagnosis not present

## 2023-01-15 DIAGNOSIS — R7989 Other specified abnormal findings of blood chemistry: Secondary | ICD-10-CM | POA: Diagnosis not present

## 2023-01-17 ENCOUNTER — Other Ambulatory Visit: Payer: Self-pay | Admitting: Urology

## 2023-01-22 ENCOUNTER — Encounter (HOSPITAL_BASED_OUTPATIENT_CLINIC_OR_DEPARTMENT_OTHER): Payer: Self-pay | Admitting: Urology

## 2023-01-22 NOTE — Progress Notes (Signed)
 Spoke w/ via phone for pre-op interview--- Tyler Calderon needs dos----  NONE       Calderon results------ COVID test -----patient states asymptomatic no test needed Arrive at -------1000 NPO after MN NO Solid Food.  Clear liquids from MN until---0900 Med rec completed Medications to take morning of surgery -----NONE Diabetic medication ----- Patient instructed no nail polish to be worn day of surgery Patient instructed to bring photo id and insurance card day of surgery Patient aware to have Driver (ride ) / caregiver    for 24 hours after surgery - Wife Tyler Calderon Patient Special Instructions ----- Pre-Op special Instructions ----- Patient verbalized understanding of instructions that were given at this phone interview. Patient denies chest pain, sob, fever, cough at the interview.

## 2023-01-26 ENCOUNTER — Encounter (HOSPITAL_BASED_OUTPATIENT_CLINIC_OR_DEPARTMENT_OTHER)
Admission: RE | Disposition: A | Discharge status: Home / Self Care | Payer: Self-pay | Source: Home / Self Care | Attending: Urology

## 2023-01-26 ENCOUNTER — Ambulatory Visit (HOSPITAL_BASED_OUTPATIENT_CLINIC_OR_DEPARTMENT_OTHER): Payer: Medicare Other | Admitting: Anesthesiology

## 2023-01-26 ENCOUNTER — Ambulatory Visit (HOSPITAL_BASED_OUTPATIENT_CLINIC_OR_DEPARTMENT_OTHER)
Admission: RE | Admit: 2023-01-26 | Discharge: 2023-01-26 | Disposition: A | Discharge status: Home / Self Care | Payer: Medicare Other | Attending: Urology | Admitting: Urology

## 2023-01-26 ENCOUNTER — Encounter (HOSPITAL_BASED_OUTPATIENT_CLINIC_OR_DEPARTMENT_OTHER): Payer: Self-pay | Admitting: Urology

## 2023-01-26 ENCOUNTER — Other Ambulatory Visit: Payer: Self-pay

## 2023-01-26 DIAGNOSIS — Z8551 Personal history of malignant neoplasm of bladder: Secondary | ICD-10-CM | POA: Insufficient documentation

## 2023-01-26 DIAGNOSIS — N475 Adhesions of prepuce and glans penis: Secondary | ICD-10-CM | POA: Insufficient documentation

## 2023-01-26 DIAGNOSIS — Z87891 Personal history of nicotine dependence: Secondary | ICD-10-CM | POA: Diagnosis not present

## 2023-01-26 DIAGNOSIS — N201 Calculus of ureter: Secondary | ICD-10-CM | POA: Diagnosis not present

## 2023-01-26 HISTORY — DX: Personal history of urinary calculi: Z87.442

## 2023-01-26 HISTORY — PX: CYSTOSCOPY/URETEROSCOPY/HOLMIUM LASER/STENT PLACEMENT: SHX6546

## 2023-01-26 SURGERY — CYSTOSCOPY/URETEROSCOPY/HOLMIUM LASER/STENT PLACEMENT
Anesthesia: General | Site: Renal | Laterality: Right

## 2023-01-26 MED ORDER — SODIUM CHLORIDE 0.9 % IR SOLN
Status: DC | PRN
  Administered 2023-01-26: 3000 mL via INTRAVESICAL

## 2023-01-26 MED ORDER — PROPOFOL 10 MG/ML IV BOLUS
INTRAVENOUS | Status: DC | PRN
  Administered 2023-01-26: 150 mg via INTRAVENOUS

## 2023-01-26 MED ORDER — FENTANYL CITRATE (PF) 100 MCG/2ML IJ SOLN
INTRAMUSCULAR | Status: AC
  Filled 2023-01-26: qty 2

## 2023-01-26 MED ORDER — IOHEXOL 300 MG/ML  SOLN
INTRAMUSCULAR | Status: DC | PRN
  Administered 2023-01-26: 7 mL

## 2023-01-26 MED ORDER — EPHEDRINE SULFATE (PRESSORS) 50 MG/ML IJ SOLN
INTRAMUSCULAR | Status: DC | PRN
  Administered 2023-01-26 (×2): 10 mg via INTRAVENOUS

## 2023-01-26 MED ORDER — DEXAMETHASONE SODIUM PHOSPHATE 4 MG/ML IJ SOLN
INTRAMUSCULAR | Status: DC | PRN
  Administered 2023-01-26: 5 mg via INTRAVENOUS

## 2023-01-26 MED ORDER — ONDANSETRON HCL 4 MG/2ML IJ SOLN
INTRAMUSCULAR | Status: DC | PRN
  Administered 2023-01-26: 4 mg via INTRAVENOUS

## 2023-01-26 MED ORDER — OXYCODONE HCL 5 MG PO TABS: 5.0000 mg | ORAL_TABLET | Freq: Once | ORAL | Status: DC | PRN

## 2023-01-26 MED ORDER — LIDOCAINE HCL (PF) 2 % IJ SOLN
INTRAMUSCULAR | Status: AC
Start: 2023-01-26 — End: ?
  Filled 2023-01-26: qty 5

## 2023-01-26 MED ORDER — OXYCODONE-ACETAMINOPHEN 5-325 MG PO TABS: 1.0000 | ORAL_TABLET | ORAL | 0 refills | Status: DC | PRN

## 2023-01-26 MED ORDER — EPHEDRINE 5 MG/ML INJ
INTRAVENOUS | Status: AC
  Filled 2023-01-26: qty 5

## 2023-01-26 MED ORDER — PROPOFOL 10 MG/ML IV BOLUS
INTRAVENOUS | Status: AC
  Filled 2023-01-26: qty 20

## 2023-01-26 MED ORDER — CEFAZOLIN SODIUM-DEXTROSE 2-4 GM/100ML-% IV SOLN
INTRAVENOUS | Status: AC
  Filled 2023-01-26: qty 100

## 2023-01-26 MED ORDER — CEFAZOLIN SODIUM-DEXTROSE 2-4 GM/100ML-% IV SOLN
2.0000 g | INTRAVENOUS | Status: AC
  Administered 2023-01-26: 2 g via INTRAVENOUS

## 2023-01-26 MED ORDER — STERILE WATER FOR IRRIGATION IR SOLN
Status: DC | PRN
  Administered 2023-01-26: 500 mL

## 2023-01-26 MED ORDER — ONDANSETRON HCL 4 MG/2ML IJ SOLN
INTRAMUSCULAR | Status: AC
  Filled 2023-01-26: qty 2

## 2023-01-26 MED ORDER — ACETAMINOPHEN 500 MG PO TABS
ORAL_TABLET | ORAL | Status: AC
  Filled 2023-01-26: qty 2

## 2023-01-26 MED ORDER — LIDOCAINE HCL (CARDIAC) PF 100 MG/5ML IV SOSY
PREFILLED_SYRINGE | INTRAVENOUS | Status: DC | PRN
  Administered 2023-01-26: 100 mg via INTRAVENOUS

## 2023-01-26 MED ORDER — MIDAZOLAM HCL 2 MG/2ML IJ SOLN
INTRAMUSCULAR | Status: AC
Start: 2023-01-26 — End: ?
  Filled 2023-01-26: qty 2

## 2023-01-26 MED ORDER — ACETAMINOPHEN 500 MG PO TABS
1000.0000 mg | ORAL_TABLET | Freq: Once | ORAL | Status: AC
  Administered 2023-01-26: 1000 mg via ORAL

## 2023-01-26 MED ORDER — FENTANYL CITRATE (PF) 100 MCG/2ML IJ SOLN
INTRAMUSCULAR | Status: DC | PRN
  Administered 2023-01-26 (×2): 50 ug via INTRAVENOUS

## 2023-01-26 MED ORDER — OXYCODONE HCL 5 MG/5ML PO SOLN: 5.0000 mg | Freq: Once | ORAL | Status: DC | PRN

## 2023-01-26 MED ORDER — DROPERIDOL 2.5 MG/ML IJ SOLN: 0.6250 mg | Freq: Once | INTRAMUSCULAR | Status: DC | PRN

## 2023-01-26 MED ORDER — MIDAZOLAM HCL 5 MG/5ML IJ SOLN
INTRAMUSCULAR | Status: DC | PRN
  Administered 2023-01-26: 2 mg via INTRAVENOUS

## 2023-01-26 MED ORDER — FENTANYL CITRATE (PF) 100 MCG/2ML IJ SOLN: 25.0000 ug | INTRAMUSCULAR | Status: DC | PRN

## 2023-01-26 MED ORDER — LACTATED RINGERS IV SOLN: INTRAVENOUS | Status: DC

## 2023-01-26 SURGICAL SUPPLY — 26 items
BAG DRAIN URO-CYSTO SKYTR STRL (DRAIN) ×1 IMPLANT
BASKET ZERO TIP NITINOL 2.4FR (BASKET) IMPLANT
BENZOIN TINCTURE PRP APPL 2/3 (GAUZE/BANDAGES/DRESSINGS) IMPLANT
CATH SET URETHRAL DILATOR (CATHETERS) IMPLANT
CATH URETERAL DUAL LUMEN 10F (MISCELLANEOUS) IMPLANT
CATH URETL OPEN 5X70 (CATHETERS) ×1 IMPLANT
CLOTH BEACON ORANGE TIMEOUT ST (SAFETY) ×1 IMPLANT
DRSG TEGADERM 2-3/8X2-3/4 SM (GAUZE/BANDAGES/DRESSINGS) IMPLANT
GLOVE BIO SURGEON STRL SZ7 (GLOVE) ×1 IMPLANT
GOWN STRL REUS W/TWL LRG LVL3 (GOWN DISPOSABLE) ×1 IMPLANT
GUIDEWIRE STR DUAL SENSOR (WIRE) ×1 IMPLANT
GUIDEWIRE ZIPWRE .038 STRAIGHT (WIRE) IMPLANT
IV NS 1000ML BAXH (IV SOLUTION) ×1 IMPLANT
IV NS IRRIG 3000ML ARTHROMATIC (IV SOLUTION) ×1 IMPLANT
KIT TURNOVER CYSTO (KITS) ×1 IMPLANT
LASER FIB FLEXIVA PULSE ID 365 (Laser) IMPLANT
MANIFOLD NEPTUNE II (INSTRUMENTS) ×1 IMPLANT
NS IRRIG 500ML POUR BTL (IV SOLUTION) ×1 IMPLANT
PACK CYSTO (CUSTOM PROCEDURE TRAY) ×1 IMPLANT
SLEEVE SCD COMPRESS KNEE MED (STOCKING) ×1 IMPLANT
STENT URET 6FRX26 CONTOUR (STENTS) IMPLANT
SYR 10ML LL (SYRINGE) ×1 IMPLANT
TRACTIP FLEXIVA PULS ID 200XHI (Laser) IMPLANT
TUBE CONNECTING 12X1/4 (SUCTIONS) ×1 IMPLANT
TUBING UROLOGY SET (TUBING) ×1 IMPLANT
WATER STERILE IRR 500ML POUR (IV SOLUTION) IMPLANT

## 2023-01-26 NOTE — Op Note (Signed)
Operative Note  Preoperative diagnosis:  1.  Right ureteral stone  Postoperative diagnosis: 1.  Right stone  Procedure(s): 1.  Cystoscopy 2. Right ureteroscopy with laser lithotripsy and basket extraction of stones 3. Right retrograde pyelogram 4. Right ureteral stent placement 5. Fluoroscopy with intraoperative interpretation  Surgeon: Jettie Pagan, MD  Assistants:  None  Anesthesia:  General  Complications:  None  EBL:  Minimal  Specimens: 1. Stones for stone analysis (to be done at [facility])  Drains/Catheters: 1.  Right 6Fr x 26cm ureteral stent with a tether string  Intraoperative findings:   Cystoscopy demonstrated no suspicious recurrent bladder tumors. He had prior scar on trigone overlying right ureter. Right retrograde pyelogram with no hydronephrosis, no extravasation of contrast.  Ureteroscopy demonstrated distal 6mm stone successfully fragmented and removed. Successful stent placement.  Indication:  Tyler Calderon is a 73 y.o. male with a right ureteral stone here for definitive treatment.  Description of procedure: After informed consent was obtained from the patient, the patient was identified and taken to the operating room and placed in the supine position.  General anesthesia was administered as well as perioperative IV antibiotics.  At the beginning of the case, a time-out was performed to properly identify the patient, the surgery to be performed, and the surgical site.  Sequential compression devices were applied to the lower extremities at the beginning of the case for DVT prophylaxis.  The patient was then placed in the dorsal lithotomy supine position, prepped and draped in sterile fashion.  We then passed the 21-French rigid cystoscope through the urethra and into the bladder under vision without any difficulty, noting a normal urethra with a wide caliber bulbar urethral stricture and a mildly obstructing prostate.  A systematic evaluation of  the bladder revealed no evidence of any suspicious bladder lesions.  Ureteral orifices were in normal position.    Under cystoscopic and flouroscopic guidance, we cannulated the right ureteral orifice with a 5-French open-ended ureteral catheter and a gentle retrograde pyelogram was performed, revealing a normal caliber ureter without any filling defects. There was no hydronephrosis of the collecting system. A 0.038 sensor wire was then passed up to the level of the renal pelvis and secured to the drape as a safety wire. The ureteral catheter and cystoscope were removed, leaving the safety wire in place.   A semi-rigid ureteroscope was passed alongside the wire up the distal ureter which appeared normal.  The calculus was identified at the distal right ureter. Using the 272 micron holmium laser fiber, the stone was fragmented completely. A 2.2 Fr zero tip basket was used to remove the fragments under visual guidance. I re-introduced the scope and there were no fragments in the ureter.  Prior to removing the ureteroscope, we did pass the Glidewire back up to the ureter to the renal pelvis.  Once the ureteroscope was removed, we then used the Glidewire under fluoroscopic guidance and passed up a 6-French x 26 cm double-pigtail ureteral stent up the ureter, making sure that the proximal and distal ends coiled within the kidney and bladder respectively.  Note that we left a long tether string attached to the distal end of the ureteral stent and it exited the urethral meatus and was secured to the penile shaft with a tegaderm adhesive.  The cystoscope was then advanced back into the bladder under vision.  We were able to see the distal stent coiling nicely within the bladder.  The bladder was then emptied with irrigation solution.  The cystoscope was then removed.    The patient tolerated the procedure well and there was no complication. Patient was awoken from anesthesia and taken to the recovery room in  stable condition. I was present and scrubbed for the entirety of the case.  Plan:  Patient will be discharged home and remove stent by pulling on attached string on Monday AM.  Matt R. Shoji Pertuit MD [facility]  Pager: [phone]

## 2023-01-26 NOTE — Anesthesia Procedure Notes (Signed)
Procedure Name: LMA Insertion Date/Time: 01/26/2023 10:22 AM  Performed by: Jessica Priest, CRNAPre-anesthesia Checklist: Patient identified, Emergency Drugs available, Suction available, Patient being monitored and Timeout performed Patient Re-evaluated:Patient Re-evaluated prior to induction Oxygen Delivery Method: Circle system utilized Preoxygenation: Pre-oxygenation with 100% oxygen Induction Type: IV induction Ventilation: Mask ventilation without difficulty LMA: LMA inserted LMA Size: 4.0 Number of attempts: 1 Airway Equipment and Method: Bite block Placement Confirmation: positive ETCO2, breath sounds checked- equal and bilateral and CO2 detector Tube secured with: Tape Dental Injury: Teeth and Oropharynx as per pre-operative assessment

## 2023-01-26 NOTE — Anesthesia Postprocedure Evaluation (Signed)
Anesthesia Post Note  Patient: CORVELL SOHMER  Procedure(s) Performed: CYSTOSCOPY/RIGHT RETROGRADE PYELOGRAM/RIGHT URETEROSCOPY/HOLMIUM LASER/RIGHT STENT PLACEMENT (Right: Renal)     Patient location during evaluation: PACU Anesthesia Type: General Level of consciousness: awake and alert Pain management: pain level controlled Vital Signs Assessment: post-procedure vital signs reviewed and stable Respiratory status: spontaneous breathing, nonlabored ventilation and respiratory function stable Cardiovascular status: blood pressure returned to baseline Postop Assessment: no apparent nausea or vomiting Anesthetic complications: no   No notable events documented.  Last Vitals:  Vitals:   01/26/23 1115 01/26/23 1130  BP: 128/69 125/73  Pulse: 88 76  Resp: 11 10  Temp:  (!) 36.2 C  SpO2: 95% 97%    Last Pain:  Vitals:   01/26/23 1130  TempSrc:   PainSc: 0-No pain                 Shanda Howells

## 2023-01-26 NOTE — Discharge Instructions (Addendum)
[facility] [phone] Post Ureteroscopy With or Without Stent Instructions  Definitions:  Ureter: The duct that transports urine from the kidney to the bladder. Stent:   A plastic hollow tube that is placed into the ureter, from the kidney to the bladder to prevent the ureter from swelling shut.  GENERAL INSTRUCTIONS:  Despite the fact that no skin incisions were used, the area around the ureter and bladder is raw and irritated. The stent is a foreign body which will further irritate the bladder wall. This irritation is manifested by increased frequency of urination, both day and night, and by an increase in the urge to urinate. In some, the urge to urinate is present almost always. Sometimes the urge is strong enough that you may not be able to stop yourself from urinating. The only real cure is to remove the stent and then give time for the bladder wall to heal which can't be done until the danger of the ureter swelling shut has passed, which varies.  You may see some blood in your urine while the stent is in place and a few days afterwards. Do not be alarmed, even if the urine was clear for a while. Get off your feet and drink lots of fluids until clearing occurs. If you start to pass clots or don't improve, call us.  DIET: You may return to your normal diet immediately. Because of the raw surface of your bladder, alcohol, spicy foods, acid type foods and drinks with caffeine may cause irritation or frequency and should be used in moderation. To keep your urine flowing freely and to avoid constipation, drink plenty of fluids during the day ( 8-10 glasses ). Tip: Avoid cranberry juice because it is very acidic.  ACTIVITY: Your physical activity doesn't need to be restricted. However, if you are very active, you may see some blood in your urine. We suggest that you reduce your activity under these circumstances until the bleeding has stopped.  BOWELS: It is important to  keep your bowels regular during the postoperative period. Straining with bowel movements can cause bleeding. A bowel movement every other day is reasonable. Use a mild laxative if needed, such as Milk of Magnesia 2-3 tablespoons, or 2 Dulcolax tablets. Call if you continue to have problems. If you have been taking narcotics for pain, before, during or after your surgery, you may be constipated. Take a laxative if necessary.   MEDICATION: You should resume your pre-surgery medications unless told not to. In addition you will often be given an antibiotic to prevent infection. These should be taken as prescribed until the bottles are finished unless you are having an unusual reaction to one of the drugs.  PROBLEMS YOU SHOULD REPORT TO [place]: Fevers over 100.5 Fahrenheit. Heavy bleeding, or clots ( See above notes about blood in urine ). Inability to urinate. Drug reactions ( hives, rash, nausea, vomiting, diarrhea ). Severe burning or pain with urination that is not improving.  FOLLOW-UP: You will need a follow-up appointment to monitor your progress. Call for this appointment at the number listed above. Usually the first appointment will be about three to fourteen days after your surgery.  You have a stent taped to your penis and you may remove this by pulling on attached string on Monday AM. Post Anesthesia Home Care Instructions  Activity: Get plenty of rest for the remainder of the day. A responsible adult should stay with you for 24 hours following the procedure.  For the next 24  hours, DO NOT: -Drive a car -Advertising copywriter -Drink alcoholic beverages -Take any medication unless instructed by your physician -Make any legal decisions or sign important papers.  Meals: Start with liquid foods such as gelatin or soup. Progress to regular foods as tolerated. Avoid greasy, spicy, heavy foods. If nausea and/or vomiting occur, drink only clear liquids until the nausea and/or vomiting subsides.  Call your physician if vomiting continues.  Special Instructions/Symptoms: Your throat may feel dry or sore from the anesthesia or the breathing tube placed in your throat during surgery. If this causes discomfort, gargle with warm salt water. The discomfort should disappear within 24 hours.    Remove stent on Monday, January 29, 2023.

## 2023-01-26 NOTE — Transfer of Care (Signed)
Immediate Anesthesia Transfer of Care Note  Patient: Tyler Calderon  Procedure(s) Performed: Procedure(s) (LRB): CYSTOSCOPY/RIGHT RETROGRADE PYELOGRAM/RIGHT URETEROSCOPY/HOLMIUM LASER/RIGHT STENT PLACEMENT (Right)  Patient Location: PACU  Anesthesia Type: GA  Level of Consciousness: awake, sedated, patient cooperative and responds to stimulation, c/o pain in back - comfort measures given w/ medication   Airway & Oxygen Therapy: Patient Spontanous Breathing and Patient connected to [place] oxygen  Post-op Assessment: Report given to PACU RN, Post -op Vital signs reviewed and stable and Patient moving all extremities  Post vital signs: Reviewed and stable  Complications: No apparent anesthesia complications

## 2023-01-26 NOTE — Anesthesia Preprocedure Evaluation (Addendum)
Anesthesia Evaluation  Patient identified by MRN, date of birth, ID band Patient awake    Reviewed: Allergy & Precautions, NPO status , Patient's Chart, lab work & pertinent test results  History of Anesthesia Complications Negative for: history of anesthetic complications  Airway Mallampati: II  TM Distance: >3 FB Neck ROM: Full    Dental no notable dental hx.    Pulmonary former smoker   Pulmonary exam normal        Cardiovascular negative cardio ROS Normal cardiovascular exam     Neuro/Psych    Depression    negative neurological ROS     GI/Hepatic negative GI ROS, Neg liver ROS,,,  Endo/Other  negative endocrine ROS    Renal/GU RIGHT URETERAL STONE     Musculoskeletal negative musculoskeletal ROS (+)    Abdominal   Peds  Hematology negative hematology ROS (+)   Anesthesia Other Findings Day of surgery medications reviewed with patient.  Reproductive/Obstetrics                             Anesthesia Physical Anesthesia Plan  ASA: 2  Anesthesia Plan: General   Post-op Pain Management: Tylenol PO (pre-op)*   Induction: Intravenous  PONV Risk Score and Plan: 2 and Ondansetron, Dexamethasone and Treatment may vary due to age or medical condition  Airway Management Planned: LMA  Additional Equipment: None  Intra-op Plan:   Post-operative Plan: Extubation in OR  Informed Consent: I have reviewed the patients History and Physical, chart, labs and discussed the procedure including the risks, benefits and alternatives for the proposed anesthesia with the patient or authorized representative who has indicated his/her understanding and acceptance.     Dental advisory given  Plan Discussed with: CRNA  Anesthesia Plan Comments:        Anesthesia Quick Evaluation

## 2023-01-26 NOTE — H&P (Signed)
Visit Report     12/22/2022   --------------------------------------------------------------------------------   Tyler Calderon  MRN: [id number]  DOB: 29-Dec-1950, 73 year old Male  SSN:    PRIMARY CARE:  Gweneth Dimitri, MD  PRIMARY CARE FAX:  [phone]  REFERRING:  Bartholomew Crews, NP  PROVIDER:  Jettie Pagan, M.D.  TREATING:  Bartholomew Crews, NP  LOCATION:  [facility] 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  -S/p TURBT by Dr. Alvester Morin on 08/25/2019 with path: HG Ta UCB. Muscle not present.  S/p BCG induction completed in 2021. He did not undergo any maintenance BCG.  -S/p TURBT by Dr. Benancio Deeds for possible recurrence on 05/19/2020 with path: Urothelium hyperplasia with dysplasia.  -S/p TURBT on 09/15/2020 by Dr. Benancio Deeds with LG Ta UCB at the bladder neck 12 o'clock position.   -He has had surveillance cystoscopies every 3 months with no evidence of recurrence. He has not had any recent CT imaging. He denies gross hematuria.   #2. BPH with LUTS: IPSS score is 15, quality life 2. He complains of weak flow stream and frequency. He currently takes tamsulosin 0.4 mg daily with benefit. He states that this is satisfactory he is not interested in bladder L obstruction procedure.   3. Prostate cancer screening: Denies family history of prostate cancer. Most recent PSA is 1.1 in 11/2021.   Patient currently denies fever, chills, sweats, nausea, vomiting, abdominal or flank pain, gross hematuria or dysuria.   12/22/2022: Mr. Tyler Calderon is a 73 year old man who presents today due to a right distal 4 mm ureteral stone. This was found incidentally  on imaging. He has no pain or discomfort and has not had any pain or discomfort. He denies changes to his voiding habits. He strain religiously for 3 weeks without passing anything. KUB imaging is difficult due to multiple pelvic phleboliths.     ALLERGIES: No Known Drug Allergies    MEDICATIONS: Simvastatin 20 mg tablet  Tamsulosin Hcl 0.4 mg capsule  Valium 10 mg tablet Take 1 tab po 1 hour prior to procedure  Aller-Tec 10 mg tablet  Clear Eyes Itchy Eye Relief  Gabapentin 100 mg capsule  Lorazepam 0.5 mg tablet  Montelukast Sodium 10 mg tablet  Multivitamin  Nasacort 55 mcg aerosol, spray     Notes: Patient ran out of diazepam for when he gets his cysto and bcg's procedure/tx.   GU PSH: Bladder Instill AntiCA Agent - 2021, 2021, 2021, 2021, 2021, 2021, 2021 Cysto Dilate Stricture (M or F) - 2021 Cystoscopy - 08/24/2022, 04/06/2022, 12/26/2021, 09/19/2021, 2023, 2023, 2022, 2022, 2022, 2022, 2021, 2021 Cystoscopy TURBT 2-5 cm - 2022, 2022, 2021 Locm 300-399Mg /Ml Iodine,1Ml - 11/03/2022     NON-GU PSH: Back surgery Shoulder Surgery (Unspecified)     GU PMH: Bladder Cancer overlapping sites - 11/03/2022, - 08/24/2022, - 04/06/2022, - 02/16/2022, - 12/26/2021, - 09/19/2021, - 2023, - 2023, - 2022 BPH w/LUTS - 08/24/2022 Encounter for Prostate Cancer screening - 08/24/2022 Weak Urinary Stream - 08/24/2022 Left testicular pain - 02/16/2022 BPH w/o LUTS - 12/26/2021, - 09/19/2021, - 2023 Bladder Cancer Lateral - 2022, - 2022, - 2022, - 2022, - 2022, - 2021, - 2021, - 2021, - 2021, -  2021, - 2021, - 2021, - 2021 Bladder tumor/neoplasm - 2021 Gross hematuria - 2021 Nocturia - 2021 Urinary Frequency - 2021    NON-GU PMH: Pyuria/other UA findings - 2021 Hypercholesterolemia    FAMILY HISTORY: Congestive Heart Failure - Father Death In The Family Mother - Mother Diabetes - Uncle   SOCIAL HISTORY: Marital Status: Married Ethnicity: Not Hispanic Or Latino; Race: Dodds Current Smoking  Status: Patient does not smoke anymore. Has not smoked since 06/09/1981. Smoked for 12 years. Smoked 1 pack per day.   Tobacco Use Assessment Completed: Used Tobacco in last 30 days? Drinks 4 drinks per day.  Does not use drugs. Drinks 2 caffeinated drinks per day.    REVIEW OF SYSTEMS:    GU Review Male:   Patient denies frequent urination, hard to postpone urination, burning/ pain with urination, get up at night to urinate, leakage of urine, stream starts and stops, trouble starting your stream, have to strain to urinate , erection problems, and penile pain.  Gastrointestinal (Upper):   Patient denies nausea, vomiting, and indigestion/ heartburn.  Gastrointestinal (Lower):   Patient denies diarrhea and constipation.  Constitutional:   Patient denies fever, night sweats, weight loss, and fatigue.  Skin:   Patient denies skin rash/ lesion and itching.  Eyes:   Patient denies blurred vision and double vision.  Ears/ Nose/ Throat:   Patient denies sore throat and sinus problems.  Hematologic/Lymphatic:   Patient denies swollen glands and easy bruising.  Cardiovascular:   Patient denies leg swelling and chest pains.  Respiratory:   Patient denies cough and shortness of breath.  Endocrine:   Patient denies excessive thirst.  Musculoskeletal:   Patient denies back pain and joint pain.  Neurological:   Patient denies headaches and dizziness.  Psychologic:   Patient denies depression and anxiety.   VITAL SIGNS:      12/22/2022 03:16 PM  BP 136/78 mmHg  Pulse 76 /min  Temperature 97.1 F / 36.1 C   GU PHYSICAL EXAMINATION:    Anus and Perineum: No hemorrhoids. No anal stenosis. No rectal fissure, no anal fissure. No edema, no dimple, no perineal tenderness, no anal tenderness.  Scrotum: No lesions. No edema. No cysts. No warts.  Epididymides: Right: no spermatocele, no masses, no cysts, no tenderness, no induration, no enlargement. Left: no spermatocele, no masses, no cysts, no tenderness, no  induration, no enlargement.  Testes: No tenderness, no swelling, no enlargement left testes. No tenderness, no swelling, no enlargement right testes. Normal location left testes. Normal location right testes. No mass, no cyst, no varicocele, no hydrocele left testes. No mass, no cyst, no varicocele, no hydrocele right testes.  Urethral Meatus: Normal size. No lesion, no wart, no discharge, no polyp. Normal location.  Penis: Circumcised, no warts, no cracks. No dorsal Peyronie's plaques, no left corporal Peyronie's plaques, no right corporal Peyronie's plaques, no scarring, no warts. No balanitis, no meatal stenosis.  Prostate: 40 gram or 2+ size. Left lobe normal consistency, right lobe normal consistency. Symmetrical lobes. No prostate nodule. Left lobe no tenderness, right lobe no tenderness.  Seminal Vesicles: Nonpalpable.  Sphincter Tone: Normal sphincter. No rectal tenderness. No rectal mass.    MULTI-SYSTEM PHYSICAL EXAMINATION:    Constitutional: Well-nourished. No physical deformities. Normally developed. Good grooming.  Neck: Neck symmetrical, not swollen. Normal tracheal position.  Respiratory: No labored breathing, no use of accessory muscles.   Cardiovascular: Normal temperature, normal extremity pulses, no swelling, no varicosities.  Lymphatic: No enlargement of neck, axillae,  groin.  Skin: No paleness, no jaundice, no cyanosis. No lesion, no ulcer, no rash.  Neurologic / Psychiatric: Oriented to time, oriented to place, oriented to person. No depression, no anxiety, no agitation.  Gastrointestinal: No mass, no tenderness, no rigidity, non obese abdomen.  Eyes: Normal conjunctivae. Normal eyelids.  Ears, Nose, Mouth, and Throat: Left ear no scars, no lesions, no masses. Right ear no scars, no lesions, no masses. Nose no scars, no lesions, no masses. Normal hearing. Normal lips.  Musculoskeletal: Normal gait and station of head and neck.     Complexity of Data:  Source Of History:   Patient  Records Review:   Previous Doctor Records, Previous Patient Records  Urine Test Review:   Urinalysis  X-Ray Review: KUB: Reviewed Films. Reviewed Report. Discussed With Patient.  C.T. Abdomen/Pelvis: Reviewed Films. Reviewed Report. Discussed With Patient.     12/22/22 12/22/22  Urinalysis  Urine Appearance Clear  Clear   Urine Color Yellow  Yellow   Urine Glucose Neg  Neg mg/dL  Urine Bilirubin Neg  Neg mg/dL  Urine Ketones Neg  Neg mg/dL  Urine Specific Gravity 1.015  1.015   Urine Blood Neg  Neg ery/uL  Urine pH 7.5  7.5   Urine Protein Trace  Trace mg/dL  Urine Urobilinogen 0.2  0.2 mg/dL  Urine Nitrites Neg  Neg   Urine Leukocyte Esterase Neg  Neg leu/uL   PROCEDURES:         C.T. Urogram - [id number]      Patient confirmed No Neulasta OnPro Device.          KUB - [id number]  A single view of the abdomen is obtained. Bilateral renal shadows are visualized. Both renal shadows appear clear. There is prominent overlying bowel gas over the left renal shadow. In the pelvic inlet there are multiple phleboliths. It is difficult to say if the stone is still present or not.      Patient confirmed No Neulasta OnPro Device.           Urinalysis - 81003 Dipstick Dipstick Cont'd  Color: Yellow Bilirubin: Neg  Appearance: Clear Ketones: Neg  Specific Gravity: 1.015 Blood: Neg  pH: 7.5 Protein: Trace  Glucose: Neg Urobilinogen: 0.2    Nitrites: Neg    Leukocyte Esterase: Neg    Notes:      ASSESSMENT:      ICD-10 Details  1 GU:   Ureteral calculus - N20.1 Right, Acute, Uncomplicated   PLAN:           Orders X-Rays: C.T. Stone Protocol Without I.V. Contrast  X-Ray Notes: History:   Hematuria: Yes / No   Patient to see MD after exam: Yes/ No   Previous exam:   When:   Where:   Diabetic: Yes / No   BUN/ Creatinine:   Date of last BUN Creatinine:   Weight in pounds:   Allergy- IV Contrast: Yes/ No  Prior Authorization #: [id number]            Schedule         Document Letter(s):  Created for Patient: Clinical Summary         Notes:   Calculus is very difficult to see on KUB imaging and I therefore advised a repeat CT scan. This shows a distal right ureteral calculus measuring approximately 5 to 6 mm. The calculus appears similar to same position as stone seen on prior CT. I discussed this with him and advised ureteroscopy. He would  like to discuss his options with his wife and I will follow-up with him next week to see how he would like to proceed.   Urinalysis will be sent for precautionary culture today. Stone intervention was discussed in detail today. For ureteroscopy, the patient understands that there is a chance for a staged procedure. Patient also understands that there is risk for bleeding, infection, injury to surrounding organs, and general risks of anesthesia. The patient also understands the placement of a stent and the risks of stent placement including, risk for infection, the risk for pain, and the risk for injury. For ESWL, the patient understands that there is a chance of failure of procedure, there is also a risk for bruising, infection, bleeding, and injury to surrounding structures. The patient verbalized understanding to these risks.        Next Appointment:      Next Appointment: 02/20/2023 01:30 PM    Appointment Type: Male Cysto    Location: [facility] [phone]    Provider: Jettie Pagan, M.D.    Reason for Visit: cystoscopy/CT/ov    Urology Preoperative H&P   Chief Complaint: Right ureteral stone  History of Present Illness: Tyler Calderon is a 73 y.o. male with a right ureteral stone here for cysto, R RPG, R URS/LL, R stent. Denies fevers, chills, dysuria.    Past Medical History:  Diagnosis Date   Bladder cancer [facility]) 05/2020   urologist-- dr Benancio Deeds---  s/p TURBT   Constipation    Depression    History of immunotherapy    sept to oct 2021   History of kidney stones     Lymphocytosis asymptomatic   oncologist--- dr Bertis Ruddy--- monoclonal b cell lymphocytosis   Numbness of toes    bilateral second and thirds toes  have numb areas   Seasonal allergies    Wears glasses     Past Surgical History:  Procedure Laterality Date   CYSTOSCOPY W/ RETROGRADES Bilateral 05/18/2020   Procedure: CYSTOSCOPY WITH RETROGRADE PYELOGRAM;  Surgeon: Belva Agee, MD;  Location: [facility];  Service: Urology;  Laterality: Bilateral;   CYSTOSCOPY W/ RETROGRADES Bilateral 09/14/2020   Procedure: CYSTOSCOPY WITH RETROGRADE PYELOGRAM;  Surgeon: Belva Agee, MD;  Location: [facility];  Service: Urology;  Laterality: Bilateral;   LAMINECTOMY  1980s   Two surgeries   SHOULDER ARTHROSCOPY WITH LABRAL REPAIR Right age 56   TRANSURETHRAL RESECTION OF BLADDER TUMOR N/A 05/18/2020   Procedure: BLADDER BIOPSY OF BLADDER TUMOR WITH FULGARATION;  Surgeon: Belva Agee, MD;  Location[facility];  Service: Urology;  Laterality: N/A;  30 MINS   TRANSURETHRAL RESECTION OF BLADDER TUMOR N/A 09/14/2020   Procedure: TRANSURETHRAL RESECTION OF BLADDER TUMOR (TURBT);  Surgeon: Belva Agee, MD;  Location: [facility];  Service: Urology;  Laterality: N/A;  30 MINS   TRANSURETHRAL RESECTION OF BLADDER TUMOR WITH MITOMYCIN-C N/A 08/22/2019   Procedure: TRANSURETHRAL RESECTION OF BLADDER TUMOR WITH GENCITABINE;  Surgeon: Crista Elliot, MD;  Location: [facility];  Service: Urology;  Laterality: N/A;    Allergies: No Known Allergies  Family History  Problem Relation Age of Onset   Neurologic Disorder Mother        Corticobasal Syndrome   High blood pressure Father    Stroke Father    High Cholesterol Sister    High Cholesterol Brother    Diabetes Maternal Uncle     Social History:  reports that he quit smoking about 41 years ago. His smoking use included cigarettes. He started smoking about 53 years  ago. He has a 18 pack-year smoking history. He has never used smokeless tobacco. He reports current alcohol use. He reports current drug use. Drug: Marijuana.  ROS: A complete review of systems was performed.  All systems are negative except for pertinent findings as noted.  Physical Exam:  Vital signs in last 24 hours: Temp:  [97.9 F (36.6 C)] 97.9 F (36.6 C) (01/17 0926) Pulse Rate:  [68] 68 (01/17 0926) Resp:  [17] 17 (01/17 0926) BP: (132)/(77) 132/77 (01/17 0926) SpO2:  [98 %] 98 % (01/17 0926) Weight:  [89.3 kg] 89.3 kg (01/17 0926) Constitutional:  Alert and oriented, No acute distress Cardiovascular: Regular rate and rhythm Respiratory: Normal respiratory effort, Lungs clear bilaterally GI: Abdomen is soft, nontender, nondistended, no abdominal masses GU: No CVA tenderness Lymphatic: No lymphadenopathy Neurologic: Grossly intact, no focal deficits Psychiatric: Normal mood and affect  Laboratory Data:  No results for input(s): "WBC", "HGB", "HCT", "PLT" in the last 72 hours.  No results for input(s): "NA", "K", "CL", "GLUCOSE", "BUN", "CALCIUM", "CREATININE" in the last 72 hours.  Invalid input(s): "CO3"   No results found for this or any previous visit (from the past 24 hours). No results found for this or any previous visit (from the past 240 hours).  Renal Function: No results for input(s): "CREATININE" in the last 168 hours. CrCl cannot be calculated (No successful lab value found.).  Radiologic Imaging: No results found.  I independently reviewed the above imaging studies.  Assessment and Plan Tyler Calderon is a 73 y.o. male with a right ureteral stone here for cysto, R RPG, R URS/LL, R stent.   -The risks, benefits and alternatives of cystoscopy R RPG, R URS/LL, R stent was discussed with the patient.  Risks include, but are not limited to: bleeding, urinary tract infection, ureteral injury, ureteral stricture disease, chronic pain, urinary symptoms,  bladder injury, stent migration, the need for nephrostomy tube placement, MI, CVA, DVT, PE and the inherent risks with general anesthesia.  The patient voices understanding and wishes to proceed.      Matt R. Myria Steenbergen MD 01/26/2023, 9:51 AM  [facility] Pager: [phone]

## 2023-01-29 ENCOUNTER — Encounter (HOSPITAL_BASED_OUTPATIENT_CLINIC_OR_DEPARTMENT_OTHER): Payer: Self-pay | Admitting: Urology

## 2023-01-29 DIAGNOSIS — Z8551 Personal history of malignant neoplasm of bladder: Secondary | ICD-10-CM | POA: Diagnosis not present

## 2023-01-29 DIAGNOSIS — N201 Calculus of ureter: Secondary | ICD-10-CM | POA: Diagnosis not present

## 2023-01-31 DIAGNOSIS — K5730 Diverticulosis of large intestine without perforation or abscess without bleeding: Secondary | ICD-10-CM | POA: Diagnosis not present

## 2023-01-31 DIAGNOSIS — Z09 Encounter for follow-up examination after completed treatment for conditions other than malignant neoplasm: Secondary | ICD-10-CM | POA: Diagnosis not present

## 2023-01-31 DIAGNOSIS — K648 Other hemorrhoids: Secondary | ICD-10-CM | POA: Diagnosis not present

## 2023-01-31 DIAGNOSIS — Z860101 Personal history of adenomatous and serrated colon polyps: Secondary | ICD-10-CM | POA: Diagnosis not present

## 2023-01-31 DIAGNOSIS — D125 Benign neoplasm of sigmoid colon: Secondary | ICD-10-CM | POA: Diagnosis not present

## 2023-02-02 DIAGNOSIS — D125 Benign neoplasm of sigmoid colon: Secondary | ICD-10-CM | POA: Diagnosis not present

## 2023-02-05 DIAGNOSIS — C678 Malignant neoplasm of overlapping sites of bladder: Secondary | ICD-10-CM | POA: Diagnosis not present

## 2023-02-13 ENCOUNTER — Encounter: Payer: Self-pay | Admitting: Hematology and Oncology

## 2023-02-13 ENCOUNTER — Inpatient Hospital Stay (HOSPITAL_BASED_OUTPATIENT_CLINIC_OR_DEPARTMENT_OTHER): Payer: Medicare Other | Admitting: Hematology and Oncology

## 2023-02-13 ENCOUNTER — Inpatient Hospital Stay: Payer: Medicare Other | Attending: Hematology and Oncology

## 2023-02-13 VITALS — BP 127/67 | HR 105 | Temp 98.5°F | Resp 18 | Ht 74.0 in | Wt 193.6 lb

## 2023-02-13 DIAGNOSIS — D696 Thrombocytopenia, unspecified: Secondary | ICD-10-CM | POA: Diagnosis not present

## 2023-02-13 DIAGNOSIS — K760 Fatty (change of) liver, not elsewhere classified: Secondary | ICD-10-CM | POA: Insufficient documentation

## 2023-02-13 DIAGNOSIS — R0602 Shortness of breath: Secondary | ICD-10-CM | POA: Insufficient documentation

## 2023-02-13 DIAGNOSIS — D72820 Lymphocytosis (symptomatic): Secondary | ICD-10-CM

## 2023-02-13 DIAGNOSIS — R634 Abnormal weight loss: Secondary | ICD-10-CM | POA: Diagnosis not present

## 2023-02-13 DIAGNOSIS — Z79899 Other long term (current) drug therapy: Secondary | ICD-10-CM | POA: Diagnosis not present

## 2023-02-13 LAB — CBC WITH DIFFERENTIAL (CANCER CENTER ONLY)
Abs Immature Granulocytes: 0.03 10*3/uL (ref 0.00–0.07)
Basophils Absolute: 0.1 10*3/uL (ref 0.0–0.1)
Basophils Relative: 1 %
Eosinophils Absolute: 0.3 10*3/uL (ref 0.0–0.5)
Eosinophils Relative: 2 %
HCT: 44 % (ref 39.0–52.0)
Hemoglobin: 15 g/dL (ref 13.0–17.0)
Immature Granulocytes: 0 %
Lymphocytes Relative: 57 %
Lymphs Abs: 6.7 10*3/uL — ABNORMAL HIGH (ref 0.7–4.0)
MCH: 31.2 pg (ref 26.0–34.0)
MCHC: 34.1 g/dL (ref 30.0–36.0)
MCV: 91.5 fL (ref 80.0–100.0)
Monocytes Absolute: 0.9 10*3/uL (ref 0.1–1.0)
Monocytes Relative: 8 %
Neutro Abs: 3.7 10*3/uL (ref 1.7–7.7)
Neutrophils Relative %: 32 %
Platelet Count: 160 10*3/uL (ref 150–400)
RBC: 4.81 MIL/uL (ref 4.22–5.81)
RDW: 13.8 % (ref 11.5–15.5)
WBC Count: 11.8 10*3/uL — ABNORMAL HIGH (ref 4.0–10.5)
nRBC: 0 % (ref 0.0–0.2)

## 2023-02-13 NOTE — Progress Notes (Signed)
 [facility] OFFICE PROGRESS NOTE  Patient Care Team: Aisha Harvey, MD as PCP - General (Family Medicine)  ASSESSMENT & PLAN:  Monoclonal B-cell lymphocytosis of undetermined significance We discussed the natural history of monoclonal B cell lymphocytosis of unknown significance He is not symptomatic His Toran blood cell count is now high and lymphocytosis is slightly higher He is educated to watch out for signs and symptoms of disease such as unexplained lymphadenopathy and unresolved infection. He does not need treatment I will see him once a year We discussed importance of annual influenza vaccination  Thrombocytopenia (HCC) His last CT imaging show evidence of hepatic steatosis His thrombocytopenia is likely related to that, but this has resolved this year likely due to recent weight loss Observe closely for now  No orders of the defined types were placed in this encounter.   All questions were answered. The patient knows to call the clinic with any problems, questions or concerns. The total time spent in the appointment was 20 minutes encounter with patients including review of chart and various tests results, discussions about plan of care and coordination of care plan   Tyler Bedford, MD 02/13/2023 1:02 PM  INTERVAL HISTORY: Please see below for problem oriented charting. he returns for surveillance follow-up He had recent colonoscopy and kidney stone procedure He is doing well He has lost some weight due to recent procedures No recent infection No new lymphadenopathy We discussed CBC results today  REVIEW OF SYSTEMS:   Constitutional: Denies fevers, chills or abnormal weight loss Eyes: Denies blurriness of vision Ears, nose, mouth, throat, and face: Denies mucositis or sore throat Respiratory: Denies cough, dyspnea or wheezes Cardiovascular: Denies palpitation, chest discomfort or lower extremity swelling Gastrointestinal:  Denies nausea, heartburn or  change in bowel habits Skin: Denies abnormal skin rashes Lymphatics: Denies new lymphadenopathy or easy bruising Neurological:Denies numbness, tingling or new weaknesses Behavioral/Psych: Mood is stable, no new changes  All other systems were reviewed with the patient and are negative.  I have reviewed the past medical history, past surgical history, social history and family history with the patient and they are unchanged from previous note.  ALLERGIES:  has no known allergies.  MEDICATIONS:  Current Outpatient Medications  Medication Sig Dispense Refill   cetirizine (ZYRTEC) 10 MG tablet Take 10 mg by mouth at bedtime.     LORazepam (ATIVAN) 0.5 MG tablet 1-2 TABS BEFORE BEDTIME AS NEEDED ORALLY 30 DAYS  0   montelukast  (SINGULAIR ) 10 MG tablet Take 1 tablet (10 mg total) by mouth at bedtime. 90 tablet 1   NASACORT ALLERGY 24HR 55 MCG/ACT AERO nasal inhaler every other day. Use one spray in each nostril once daily as directed.     oxyCODONE -acetaminophen  (PERCOCET) 5-325 MG tablet Take 1 tablet by mouth every 4 (four) hours as needed for up to 20 doses for severe pain (pain score 7-10). 20 tablet 0   simvastatin (ZOCOR) 20 MG tablet Take 20 mg by mouth at bedtime.      tamsulosin  (FLOMAX ) 0.4 MG CAPS capsule at bedtime.      No current facility-administered medications for this visit.    SUMMARY OF ONCOLOGIC HISTORY:  Tyler Calderon is here because of elevated lymphocyte count. He is accompanied by his wife, Tyler Calderon The patient is a retired quarry manager.  He does not smoke. He has chronic history of sinusitis/allergies and is followed by an allergist Most recently, he complained of shortness of breath on exertion.  He underwent  cardiac evaluation including a negative treadmill test  On his blood work, he was noted to have mild lymphocytosis.  Flow cytometry detected monoclonal B-cell population less than 5000, nonspecific phenotype.  He was found to have abnormal CBC from  his blood works in September 2019.  The total WBC is within normal limits.  The total absolute lymphocyte count is less than 5000 He denies recent infection. The last prescription antibiotics was more than 3 months ago There is not reported symptoms of urinary frequency/urgency or dysuria, diarrhea, joint swelling/pain or abnormal skin rash.  He had no prior history or diagnosis of cancer. His age appropriate screening programs are up-to-date. The patient has no prior diagnosis of autoimmune disease and was not prescribed corticosteroids related products. Last year, he was diagnosed with bladder cancer status post TURBT and instillation of chemotherapy Staging CT imaging from 2021 did not show any evidence of lymphadenopathy  PHYSICAL EXAMINATION: ECOG PERFORMANCE STATUS: 0 - Asymptomatic  Vitals:   02/13/23 1224  BP: 127/67  Pulse: (!) 105  Resp: 18  Temp: 98.5 F (36.9 C)  SpO2: 97%   Filed Weights   02/13/23 1224  Weight: 193 lb 9.6 oz (87.8 kg)    GENERAL:alert, no distress and comfortable  LABORATORY DATA:  I have reviewed the data as listed No results found for: NA, K, CL, CO2, GLUCOSE, BUN, CREATININE, CALCIUM, PROT, ALBUMIN, AST, ALT, ALKPHOS, BILITOT, GFRNONAA, GFRAA  No results found for: SPEP, UPEP  Lab Results  Component Value Date   WBC 11.8 (H) 02/13/2023   NEUTROABS 3.7 02/13/2023   HGB 15.0 02/13/2023   HCT 44.0 02/13/2023   MCV 91.5 02/13/2023   PLT 160 02/13/2023      Chemistry   No results found for: NA, K, CL, CO2, BUN, CREATININE, GLU No results found for: CALCIUM, ALKPHOS, AST, ALT, BILITOT

## 2023-02-13 NOTE — Assessment & Plan Note (Signed)
His last CT imaging show evidence of hepatic steatosis His thrombocytopenia is likely related to that, but this has resolved this year likely due to recent weight loss Observe closely for now

## 2023-02-13 NOTE — Assessment & Plan Note (Signed)
 We discussed the natural history of monoclonal B cell lymphocytosis of unknown significance He is not symptomatic His Humble blood cell count is now high and lymphocytosis is slightly higher He is educated to watch out for signs and symptoms of disease such as unexplained lymphadenopathy and unresolved infection. He does not need treatment I will see him once a year We discussed importance of annual influenza vaccination

## 2023-02-14 DIAGNOSIS — N200 Calculus of kidney: Secondary | ICD-10-CM | POA: Diagnosis not present

## 2023-02-14 DIAGNOSIS — Z8551 Personal history of malignant neoplasm of bladder: Secondary | ICD-10-CM | POA: Diagnosis not present

## 2023-02-14 DIAGNOSIS — K5730 Diverticulosis of large intestine without perforation or abscess without bleeding: Secondary | ICD-10-CM | POA: Diagnosis not present

## 2023-02-14 DIAGNOSIS — K760 Fatty (change of) liver, not elsewhere classified: Secondary | ICD-10-CM | POA: Diagnosis not present

## 2023-02-14 DIAGNOSIS — C678 Malignant neoplasm of overlapping sites of bladder: Secondary | ICD-10-CM | POA: Diagnosis not present

## 2023-02-27 DIAGNOSIS — N401 Enlarged prostate with lower urinary tract symptoms: Secondary | ICD-10-CM | POA: Diagnosis not present

## 2023-02-27 DIAGNOSIS — N201 Calculus of ureter: Secondary | ICD-10-CM | POA: Diagnosis not present

## 2023-02-27 DIAGNOSIS — C678 Malignant neoplasm of overlapping sites of bladder: Secondary | ICD-10-CM | POA: Diagnosis not present

## 2023-02-27 DIAGNOSIS — R3912 Poor urinary stream: Secondary | ICD-10-CM | POA: Diagnosis not present

## 2023-06-04 DIAGNOSIS — R5383 Other fatigue: Secondary | ICD-10-CM | POA: Diagnosis not present

## 2023-06-04 DIAGNOSIS — R051 Acute cough: Secondary | ICD-10-CM | POA: Diagnosis not present

## 2023-06-04 DIAGNOSIS — R509 Fever, unspecified: Secondary | ICD-10-CM | POA: Diagnosis not present

## 2023-06-04 DIAGNOSIS — Z03818 Encounter for observation for suspected exposure to other biological agents ruled out: Secondary | ICD-10-CM | POA: Diagnosis not present

## 2023-06-19 DIAGNOSIS — L570 Actinic keratosis: Secondary | ICD-10-CM | POA: Diagnosis not present

## 2023-06-19 DIAGNOSIS — D225 Melanocytic nevi of trunk: Secondary | ICD-10-CM | POA: Diagnosis not present

## 2023-06-19 DIAGNOSIS — D692 Other nonthrombocytopenic purpura: Secondary | ICD-10-CM | POA: Diagnosis not present

## 2023-06-19 DIAGNOSIS — D2261 Melanocytic nevi of right upper limb, including shoulder: Secondary | ICD-10-CM | POA: Diagnosis not present

## 2023-06-19 DIAGNOSIS — L300 Nummular dermatitis: Secondary | ICD-10-CM | POA: Diagnosis not present

## 2023-06-19 DIAGNOSIS — D224 Melanocytic nevi of scalp and neck: Secondary | ICD-10-CM | POA: Diagnosis not present

## 2023-06-19 DIAGNOSIS — Z85828 Personal history of other malignant neoplasm of skin: Secondary | ICD-10-CM | POA: Diagnosis not present

## 2023-06-19 DIAGNOSIS — D2262 Melanocytic nevi of left upper limb, including shoulder: Secondary | ICD-10-CM | POA: Diagnosis not present

## 2023-06-19 DIAGNOSIS — L738 Other specified follicular disorders: Secondary | ICD-10-CM | POA: Diagnosis not present

## 2023-06-19 DIAGNOSIS — I788 Other diseases of capillaries: Secondary | ICD-10-CM | POA: Diagnosis not present

## 2023-06-19 DIAGNOSIS — L821 Other seborrheic keratosis: Secondary | ICD-10-CM | POA: Diagnosis not present

## 2023-08-28 DIAGNOSIS — R8289 Other abnormal findings on cytological and histological examination of urine: Secondary | ICD-10-CM | POA: Diagnosis not present

## 2023-08-28 DIAGNOSIS — C678 Malignant neoplasm of overlapping sites of bladder: Secondary | ICD-10-CM | POA: Diagnosis not present

## 2023-08-28 DIAGNOSIS — Z125 Encounter for screening for malignant neoplasm of prostate: Secondary | ICD-10-CM | POA: Diagnosis not present

## 2023-10-11 DIAGNOSIS — Z23 Encounter for immunization: Secondary | ICD-10-CM | POA: Diagnosis not present

## 2023-12-12 DIAGNOSIS — R7309 Other abnormal glucose: Secondary | ICD-10-CM | POA: Diagnosis not present

## 2023-12-12 DIAGNOSIS — R7989 Other specified abnormal findings of blood chemistry: Secondary | ICD-10-CM | POA: Diagnosis not present

## 2023-12-12 DIAGNOSIS — E7800 Pure hypercholesterolemia, unspecified: Secondary | ICD-10-CM | POA: Diagnosis not present

## 2023-12-12 DIAGNOSIS — Z125 Encounter for screening for malignant neoplasm of prostate: Secondary | ICD-10-CM | POA: Diagnosis not present

## 2024-02-13 ENCOUNTER — Telehealth: Payer: Self-pay

## 2024-02-13 NOTE — Telephone Encounter (Signed)
 Appointment has been moved from Friday, 2/6, to Thursday, 2/5, due to overbooking.

## 2024-02-14 ENCOUNTER — Inpatient Hospital Stay

## 2024-02-14 ENCOUNTER — Other Ambulatory Visit: Payer: Self-pay

## 2024-02-14 ENCOUNTER — Inpatient Hospital Stay: Admitting: Hematology and Oncology

## 2024-02-14 VITALS — BP 124/83 | HR 89 | Temp 98.4°F | Resp 18 | Ht 74.0 in | Wt 188.2 lb

## 2024-02-14 DIAGNOSIS — D72820 Lymphocytosis (symptomatic): Secondary | ICD-10-CM

## 2024-02-14 DIAGNOSIS — Z8551 Personal history of malignant neoplasm of bladder: Secondary | ICD-10-CM

## 2024-02-14 DIAGNOSIS — D696 Thrombocytopenia, unspecified: Secondary | ICD-10-CM

## 2024-02-14 LAB — CBC WITH DIFFERENTIAL (CANCER CENTER ONLY)
Abs Immature Granulocytes: 0.04 10*3/uL (ref 0.00–0.07)
Basophils Absolute: 0.1 10*3/uL (ref 0.0–0.1)
Basophils Relative: 1 %
Eosinophils Absolute: 0.2 10*3/uL (ref 0.0–0.5)
Eosinophils Relative: 2 %
HCT: 43.1 % (ref 39.0–52.0)
Hemoglobin: 14.9 g/dL (ref 13.0–17.0)
Immature Granulocytes: 0 %
Lymphocytes Relative: 60 %
Lymphs Abs: 8.5 10*3/uL — ABNORMAL HIGH (ref 0.7–4.0)
MCH: 32 pg (ref 26.0–34.0)
MCHC: 34.6 g/dL (ref 30.0–36.0)
MCV: 92.5 fL (ref 80.0–100.0)
Monocytes Absolute: 1.1 10*3/uL — ABNORMAL HIGH (ref 0.1–1.0)
Monocytes Relative: 7 %
Neutro Abs: 4.3 10*3/uL (ref 1.7–7.7)
Neutrophils Relative %: 30 %
Platelet Count: 161 10*3/uL (ref 150–400)
RBC: 4.66 MIL/uL (ref 4.22–5.81)
RDW: 13.9 % (ref 11.5–15.5)
WBC Count: 14.2 10*3/uL — ABNORMAL HIGH (ref 4.0–10.5)
nRBC: 0 % (ref 0.0–0.2)

## 2024-02-14 NOTE — Progress Notes (Signed)
 [facility] OFFICE PROGRESS NOTE  Patient Care Team: Aisha Harvey, MD as PCP - General [facility] Medicine)  Assessment & Plan Monoclonal B-cell lymphocytosis of undetermined significance The patient have incidental discovery of monoclonal B-cell lymphocytosis of unknown significance, confirmed on flow cytometry in 2019 He has been placed on observation since then His exam is benign and he is completely asymptomatic  Total Feger blood cell count today is slightly elevated compared to previous visit without signs of anemia or thrombocytopenia We will continue once a year monitoring History of bladder cancer I have reviewed his surgical report and pathology report I would defer to urologist for management  Orders Placed This Encounter  Procedures   CBC with Differential (Cancer Center Only)    Standing Status:   Future    Expiration Date:   02/13/2025     Almarie Bedford, MD  INTERVAL HISTORY: he returns for surveillance follow-up for diagnosis of monoclonal B-cell lymphocytosis of unknown significance He is doing well He had influenza infection a month ago that resolved without treatment He continues to follow with urologist for bladder tumor and is asymptomatic  PHYSICAL EXAMINATION: ECOG PERFORMANCE STATUS: 0 - Asymptomatic  Vitals:   02/14/24 1502  BP: 124/83  Pulse: 89  Resp: 18  Temp: 98.4 F (36.9 C)  SpO2: 97%   Filed Weights   02/14/24 1502  Weight: 188 lb 3.2 oz (85.4 kg)   GENERAL:alert, no distress and comfortable SKIN: skin color, texture, turgor are normal, no rashes or significant lesions EYES: normal, conjunctiva are pink and non-injected, sclera clear OROPHARYNX:no exudate, no erythema and lips, buccal mucosa, and tongue normal  NECK: supple, thyroid normal size, non-tender, without nodularity LYMPH:  no palpable lymphadenopathy in the cervical, axillary or inguinal LUNGS: clear to auscultation and percussion with normal breathing  effort HEART: regular rate & rhythm and no murmurs and no lower extremity edema ABDOMEN:abdomen soft, non-tender and normal bowel sounds Musculoskeletal:no cyanosis of digits and no clubbing  PSYCH: alert & oriented x 3 with fluent speech NEURO: no focal motor/sensory deficits  Relevant data reviewed during this visit included CBC with differential

## 2024-02-14 NOTE — Assessment & Plan Note (Addendum)
 I have reviewed his surgical report and pathology report I would defer to urologist for management

## 2024-02-14 NOTE — Assessment & Plan Note (Addendum)
 The patient have incidental discovery of monoclonal B-cell lymphocytosis of unknown significance, confirmed on flow cytometry in 2019 He has been placed on observation since then His exam is benign and he is completely asymptomatic  Total Rezek blood cell count today is slightly elevated compared to previous visit without signs of anemia or thrombocytopenia We will continue once a year monitoring

## 2024-02-15 ENCOUNTER — Inpatient Hospital Stay: Payer: Medicare Other | Admitting: Hematology and Oncology

## 2024-02-15 ENCOUNTER — Inpatient Hospital Stay: Payer: Medicare Other

## 2025-02-16 ENCOUNTER — Inpatient Hospital Stay

## 2025-02-16 ENCOUNTER — Inpatient Hospital Stay: Admitting: Hematology and Oncology
